# Patient Record
Sex: Female | Born: 1970 | Race: Asian | Hispanic: No | Marital: Married | Smoking: Never smoker
Health system: Southern US, Community
[De-identification: ages and names within clinical notes are randomized; demographics above are authoritative.]

## PROBLEM LIST (undated history)

## (undated) DIAGNOSIS — K922 Gastrointestinal hemorrhage, unspecified: Secondary | ICD-10-CM

## (undated) DIAGNOSIS — B1910 Unspecified viral hepatitis B without hepatic coma: Secondary | ICD-10-CM

## (undated) DIAGNOSIS — Z9221 Personal history of antineoplastic chemotherapy: Secondary | ICD-10-CM

## (undated) DIAGNOSIS — D649 Anemia, unspecified: Secondary | ICD-10-CM

## (undated) DIAGNOSIS — Z923 Personal history of irradiation: Secondary | ICD-10-CM

## (undated) DIAGNOSIS — K279 Peptic ulcer, site unspecified, unspecified as acute or chronic, without hemorrhage or perforation: Secondary | ICD-10-CM

## (undated) DIAGNOSIS — C50919 Malignant neoplasm of unspecified site of unspecified female breast: Secondary | ICD-10-CM

## (undated) DIAGNOSIS — B181 Chronic viral hepatitis B without delta-agent: Secondary | ICD-10-CM

## (undated) DIAGNOSIS — K2970 Gastritis, unspecified, without bleeding: Secondary | ICD-10-CM

## (undated) DIAGNOSIS — Z9012 Acquired absence of left breast and nipple: Secondary | ICD-10-CM

## (undated) HISTORY — DX: Peptic ulcer, site unspecified, unspecified as acute or chronic, without hemorrhage or perforation: K27.9

## (undated) HISTORY — DX: Chronic viral hepatitis B without delta-agent: B18.1

## (undated) HISTORY — DX: Gastritis, unspecified, without bleeding: K29.70

## (undated) HISTORY — DX: Gastrointestinal hemorrhage, unspecified: K92.2

## (undated) HISTORY — DX: Anemia, unspecified: D64.9

## (undated) HISTORY — DX: Malignant neoplasm of unspecified site of unspecified female breast: C50.919

## (undated) HISTORY — DX: Unspecified viral hepatitis B without hepatic coma: B19.10

## (undated) HISTORY — PX: PORTACATH PLACEMENT: SHX2246

## (undated) HISTORY — DX: Acquired absence of left breast and nipple: Z90.12

---

## 1996-04-01 DIAGNOSIS — B181 Chronic viral hepatitis B without delta-agent: Secondary | ICD-10-CM

## 1996-04-01 HISTORY — DX: Chronic viral hepatitis B without delta-agent: B18.1

## 1997-11-22 ENCOUNTER — Emergency Department (HOSPITAL_COMMUNITY): Admission: EM | Admit: 1997-11-22 | Discharge: 1997-11-22 | Payer: Self-pay | Admitting: Emergency Medicine

## 2006-08-11 ENCOUNTER — Encounter: Admission: RE | Admit: 2006-08-11 | Discharge: 2006-08-11 | Payer: Self-pay | Admitting: Obstetrics and Gynecology

## 2009-04-01 DIAGNOSIS — C50919 Malignant neoplasm of unspecified site of unspecified female breast: Secondary | ICD-10-CM

## 2009-04-01 DIAGNOSIS — Z9012 Acquired absence of left breast and nipple: Secondary | ICD-10-CM

## 2009-04-01 DIAGNOSIS — Z9221 Personal history of antineoplastic chemotherapy: Secondary | ICD-10-CM

## 2009-04-01 DIAGNOSIS — Z923 Personal history of irradiation: Secondary | ICD-10-CM

## 2009-04-01 HISTORY — DX: Acquired absence of left breast and nipple: Z90.12

## 2009-04-01 HISTORY — PX: MASTECTOMY: SHX3

## 2009-04-01 HISTORY — DX: Personal history of irradiation: Z92.3

## 2009-04-01 HISTORY — DX: Personal history of antineoplastic chemotherapy: Z92.21

## 2009-04-01 HISTORY — DX: Malignant neoplasm of unspecified site of unspecified female breast: C50.919

## 2009-04-01 HISTORY — PX: OTHER SURGICAL HISTORY: SHX169

## 2009-08-08 ENCOUNTER — Encounter: Admission: RE | Admit: 2009-08-08 | Discharge: 2009-08-08 | Payer: Self-pay | Admitting: General Surgery

## 2009-08-15 ENCOUNTER — Ambulatory Visit: Payer: Self-pay | Admitting: Oncology

## 2009-09-07 ENCOUNTER — Ambulatory Visit: Payer: Self-pay | Admitting: Genetic Counselor

## 2009-10-06 ENCOUNTER — Ambulatory Visit: Payer: Self-pay | Admitting: Oncology

## 2009-10-31 ENCOUNTER — Ambulatory Visit (HOSPITAL_COMMUNITY): Admission: RE | Admit: 2009-10-31 | Discharge: 2009-10-31 | Payer: Self-pay | Admitting: Surgery

## 2009-10-31 ENCOUNTER — Ambulatory Visit (HOSPITAL_COMMUNITY): Admission: RE | Admit: 2009-10-31 | Discharge: 2009-11-01 | Payer: Self-pay | Admitting: Surgery

## 2009-10-31 ENCOUNTER — Encounter (INDEPENDENT_AMBULATORY_CARE_PROVIDER_SITE_OTHER): Payer: Self-pay | Admitting: Surgery

## 2009-12-12 ENCOUNTER — Ambulatory Visit: Payer: Self-pay | Admitting: Oncology

## 2009-12-14 LAB — CBC WITH DIFFERENTIAL/PLATELET
BASO%: 0.6 % (ref 0.0–2.0)
EOS%: 0.6 % (ref 0.0–7.0)
Eosinophils Absolute: 0 10*3/uL (ref 0.0–0.5)
HCT: 39.6 % (ref 34.8–46.6)
MCH: 31.4 pg (ref 25.1–34.0)
MCV: 91.6 fL (ref 79.5–101.0)
NEUT#: 3.9 10*3/uL (ref 1.5–6.5)
RBC: 4.32 10*6/uL (ref 3.70–5.45)
WBC: 5.7 10*3/uL (ref 3.9–10.3)
lymph#: 1.6 10*3/uL (ref 0.9–3.3)

## 2009-12-15 ENCOUNTER — Ambulatory Visit: Payer: Self-pay | Admitting: Internal Medicine

## 2009-12-15 ENCOUNTER — Encounter: Payer: Self-pay | Admitting: Internal Medicine

## 2009-12-15 ENCOUNTER — Ambulatory Visit (HOSPITAL_COMMUNITY): Admission: RE | Admit: 2009-12-15 | Discharge: 2009-12-15 | Payer: Self-pay | Admitting: Oncology

## 2009-12-15 ENCOUNTER — Ambulatory Visit: Payer: Self-pay

## 2009-12-15 ENCOUNTER — Encounter: Payer: Self-pay | Admitting: Oncology

## 2009-12-15 LAB — COMPREHENSIVE METABOLIC PANEL
ALT: 16 U/L (ref 0–35)
AST: 18 U/L (ref 0–37)
Alkaline Phosphatase: 59 U/L (ref 39–117)
BUN: 8 mg/dL (ref 6–23)
CO2: 27 mEq/L (ref 19–32)
Calcium: 8.9 mg/dL (ref 8.4–10.5)
Creatinine, Ser: 0.64 mg/dL (ref 0.40–1.20)
Glucose, Bld: 96 mg/dL (ref 70–99)

## 2009-12-21 ENCOUNTER — Ambulatory Visit (HOSPITAL_COMMUNITY): Admission: RE | Admit: 2009-12-21 | Discharge: 2009-12-21 | Payer: Self-pay | Admitting: Oncology

## 2010-01-02 ENCOUNTER — Ambulatory Visit (HOSPITAL_COMMUNITY): Admission: RE | Admit: 2010-01-02 | Discharge: 2010-01-02 | Payer: Self-pay | Admitting: Surgery

## 2010-01-03 LAB — CBC WITH DIFFERENTIAL/PLATELET
BASO%: 0.2 % (ref 0.0–2.0)
Basophils Absolute: 0 10*3/uL (ref 0.0–0.1)
MCHC: 35.3 g/dL (ref 31.5–36.0)
MCV: 89.2 fL (ref 79.5–101.0)
Platelets: 193 10*3/uL (ref 145–400)
RDW: 12.4 % (ref 11.2–14.5)

## 2010-01-09 LAB — CBC WITH DIFFERENTIAL/PLATELET
BASO%: 1 % (ref 0.0–2.0)
EOS%: 0.5 % (ref 0.0–7.0)
HGB: 14 g/dL (ref 11.6–15.9)
MCH: 31.3 pg (ref 25.1–34.0)
MCHC: 35.3 g/dL (ref 31.5–36.0)
MONO#: 2.5 10*3/uL — ABNORMAL HIGH (ref 0.1–0.9)
RBC: 4.47 10*6/uL (ref 3.70–5.45)
WBC: 12.9 10*3/uL — ABNORMAL HIGH (ref 3.9–10.3)
lymph#: 2.7 10*3/uL (ref 0.9–3.3)

## 2010-01-09 LAB — BASIC METABOLIC PANEL
Creatinine, Ser: 0.58 mg/dL (ref 0.40–1.20)
Glucose, Bld: 122 mg/dL — ABNORMAL HIGH (ref 70–99)
Potassium: 3.8 mEq/L (ref 3.5–5.3)
Sodium: 138 mEq/L (ref 135–145)

## 2010-01-15 ENCOUNTER — Ambulatory Visit: Payer: Self-pay | Admitting: Oncology

## 2010-01-16 LAB — CBC WITH DIFFERENTIAL/PLATELET
Basophils Absolute: 0 10*3/uL (ref 0.0–0.1)
LYMPH%: 14.6 % (ref 14.0–49.7)
MCHC: 35.3 g/dL (ref 31.5–36.0)
MONO#: 0.4 10*3/uL (ref 0.1–0.9)
NEUT#: 7.6 10*3/uL — ABNORMAL HIGH (ref 1.5–6.5)
NEUT%: 81.1 % — ABNORMAL HIGH (ref 38.4–76.8)
RDW: 12.8 % (ref 11.2–14.5)
WBC: 9.4 10*3/uL (ref 3.9–10.3)
lymph#: 1.4 10*3/uL (ref 0.9–3.3)

## 2010-01-23 LAB — COMPREHENSIVE METABOLIC PANEL
AST: 19 U/L (ref 0–37)
Alkaline Phosphatase: 74 U/L (ref 39–117)
BUN: 11 mg/dL (ref 6–23)
CO2: 24 mEq/L (ref 19–32)
Creatinine, Ser: 0.47 mg/dL (ref 0.40–1.20)
Potassium: 4.1 mEq/L (ref 3.5–5.3)

## 2010-01-23 LAB — CBC WITH DIFFERENTIAL/PLATELET
Basophils Absolute: 0 10*3/uL (ref 0.0–0.1)
EOS%: 0 % (ref 0.0–7.0)
Eosinophils Absolute: 0 10*3/uL (ref 0.0–0.5)
HGB: 12.7 g/dL (ref 11.6–15.9)
LYMPH%: 12.1 % — ABNORMAL LOW (ref 14.0–49.7)
MCH: 31.7 pg (ref 25.1–34.0)
NEUT%: 82.6 % — ABNORMAL HIGH (ref 38.4–76.8)
Platelets: 204 10*3/uL (ref 145–400)
WBC: 9.9 10*3/uL (ref 3.9–10.3)

## 2010-01-30 LAB — CBC WITH DIFFERENTIAL/PLATELET
Basophils Absolute: 0.1 10*3/uL (ref 0.0–0.1)
HCT: 35.1 % (ref 34.8–46.6)
HGB: 12.3 g/dL (ref 11.6–15.9)
MCHC: 35 g/dL (ref 31.5–36.0)
MCV: 89.5 fL (ref 79.5–101.0)
MONO%: 14.6 % — ABNORMAL HIGH (ref 0.0–14.0)
NEUT%: 70.7 % (ref 38.4–76.8)

## 2010-02-06 LAB — CBC WITH DIFFERENTIAL/PLATELET
EOS%: 0.1 % (ref 0.0–7.0)
Eosinophils Absolute: 0 10*3/uL (ref 0.0–0.5)
LYMPH%: 18.9 % (ref 14.0–49.7)
MCH: 31.1 pg (ref 25.1–34.0)
MONO#: 0.4 10*3/uL (ref 0.1–0.9)
NEUT#: 5.8 10*3/uL (ref 1.5–6.5)
NEUT%: 75 % (ref 38.4–76.8)
Platelets: 134 10*3/uL — ABNORMAL LOW (ref 145–400)
RBC: 3.7 10*6/uL (ref 3.70–5.45)
RDW: 14.2 % (ref 11.2–14.5)
lymph#: 1.5 10*3/uL (ref 0.9–3.3)
nRBC: 0 % (ref 0–0)

## 2010-02-13 LAB — CBC WITH DIFFERENTIAL/PLATELET
BASO%: 0 % (ref 0.0–2.0)
Basophils Absolute: 0 10e3/uL (ref 0.0–0.1)
EOS%: 0 % (ref 0.0–7.0)
Eosinophils Absolute: 0 10e3/uL (ref 0.0–0.5)
HCT: 35 % (ref 34.8–46.6)
HGB: 12.2 g/dL (ref 11.6–15.9)
LYMPH%: 8.7 % — ABNORMAL LOW (ref 14.0–49.7)
MCH: 31.5 pg (ref 25.1–34.0)
MCHC: 34.9 g/dL (ref 31.5–36.0)
MCV: 90.4 fL (ref 79.5–101.0)
MONO#: 0.2 10e3/uL (ref 0.1–0.9)
MONO%: 1.6 % (ref 0.0–14.0)
NEUT#: 8.5 10e3/uL — ABNORMAL HIGH (ref 1.5–6.5)
NEUT%: 89.7 % — ABNORMAL HIGH (ref 38.4–76.8)
Platelets: 183 10e3/uL (ref 145–400)
RBC: 3.87 10e6/uL (ref 3.70–5.45)
RDW: 14.6 % — ABNORMAL HIGH (ref 11.2–14.5)
WBC: 9.5 10e3/uL (ref 3.9–10.3)
lymph#: 0.8 10e3/uL — ABNORMAL LOW (ref 0.9–3.3)
nRBC: 0 % (ref 0–0)

## 2010-02-13 LAB — COMPREHENSIVE METABOLIC PANEL WITH GFR
ALT: 28 U/L (ref 0–35)
AST: 25 U/L (ref 0–37)
Albumin: 4.3 g/dL (ref 3.5–5.2)
Alkaline Phosphatase: 75 U/L (ref 39–117)
BUN: 11 mg/dL (ref 6–23)
CO2: 21 meq/L (ref 19–32)
Calcium: 9.5 mg/dL (ref 8.4–10.5)
Chloride: 106 meq/L (ref 96–112)
Creatinine, Ser: 0.59 mg/dL (ref 0.40–1.20)
Glucose, Bld: 163 mg/dL — ABNORMAL HIGH (ref 70–99)
Potassium: 3.9 meq/L (ref 3.5–5.3)
Sodium: 138 meq/L (ref 135–145)
Total Bilirubin: 0.6 mg/dL (ref 0.3–1.2)
Total Protein: 7 g/dL (ref 6.0–8.3)

## 2010-02-14 ENCOUNTER — Ambulatory Visit: Payer: Self-pay | Admitting: Oncology

## 2010-02-20 LAB — CBC WITH DIFFERENTIAL/PLATELET
BASO%: 0.5 % (ref 0.0–2.0)
EOS%: 0.2 % (ref 0.0–7.0)
Eosinophils Absolute: 0 10*3/uL (ref 0.0–0.5)
HCT: 33.7 % — ABNORMAL LOW (ref 34.8–46.6)
HGB: 11.9 g/dL (ref 11.6–15.9)
LYMPH%: 19.7 % (ref 14.0–49.7)
MCH: 32.1 pg (ref 25.1–34.0)
MONO%: 13.1 % (ref 0.0–14.0)
NEUT#: 8.8 10*3/uL — ABNORMAL HIGH (ref 1.5–6.5)
NEUT%: 66.5 % (ref 38.4–76.8)
RDW: 14.4 % (ref 11.2–14.5)
WBC: 13.3 10*3/uL — ABNORMAL HIGH (ref 3.9–10.3)
lymph#: 2.6 10*3/uL (ref 0.9–3.3)

## 2010-02-27 LAB — CBC WITH DIFFERENTIAL/PLATELET
BASO%: 0.4 % (ref 0.0–2.0)
EOS%: 0.1 % (ref 0.0–7.0)
Eosinophils Absolute: 0 10*3/uL (ref 0.0–0.5)
MCH: 32.8 pg (ref 25.1–34.0)
MCHC: 34.7 g/dL (ref 31.5–36.0)
MCV: 94.6 fL (ref 79.5–101.0)
MONO%: 4.2 % (ref 0.0–14.0)
NEUT#: 4.2 10*3/uL (ref 1.5–6.5)
RBC: 3.36 10*6/uL — ABNORMAL LOW (ref 3.70–5.45)
RDW: 15.9 % — ABNORMAL HIGH (ref 11.2–14.5)

## 2010-03-01 ENCOUNTER — Ambulatory Visit: Payer: Self-pay | Admitting: Gastroenterology

## 2010-03-06 LAB — CBC WITH DIFFERENTIAL/PLATELET
BASO%: 0.1 % (ref 0.0–2.0)
EOS%: 0 % (ref 0.0–7.0)
HCT: 33.8 % — ABNORMAL LOW (ref 34.8–46.6)
LYMPH%: 10.9 % — ABNORMAL LOW (ref 14.0–49.7)
MCH: 32.2 pg (ref 25.1–34.0)
MCV: 94.7 fL (ref 79.5–101.0)
MONO%: 6 % (ref 0.0–14.0)
NEUT#: 8.6 10*3/uL — ABNORMAL HIGH (ref 1.5–6.5)
NEUT%: 83 % — ABNORMAL HIGH (ref 38.4–76.8)
Platelets: 182 10*3/uL (ref 145–400)
RBC: 3.57 10*6/uL — ABNORMAL LOW (ref 3.70–5.45)
nRBC: 0 % (ref 0–0)

## 2010-03-06 LAB — COMPREHENSIVE METABOLIC PANEL
Albumin: 4.3 g/dL (ref 3.5–5.2)
Alkaline Phosphatase: 71 U/L (ref 39–117)
CO2: 22 mEq/L (ref 19–32)
Chloride: 104 mEq/L (ref 96–112)
Potassium: 4.1 mEq/L (ref 3.5–5.3)
Sodium: 140 mEq/L (ref 135–145)
Total Bilirubin: 0.7 mg/dL (ref 0.3–1.2)

## 2010-03-13 ENCOUNTER — Encounter: Payer: Self-pay | Admitting: Oncology

## 2010-03-13 ENCOUNTER — Ambulatory Visit: Admit: 2010-03-13 | Payer: Self-pay | Admitting: Oncology

## 2010-03-13 ENCOUNTER — Ambulatory Visit
Admission: RE | Admit: 2010-03-13 | Discharge: 2010-03-13 | Payer: Self-pay | Source: Home / Self Care | Attending: Oncology | Admitting: Oncology

## 2010-03-13 LAB — CBC WITH DIFFERENTIAL/PLATELET
Eosinophils Absolute: 0 10*3/uL (ref 0.0–0.5)
HCT: 32.9 % — ABNORMAL LOW (ref 34.8–46.6)
HGB: 11.2 g/dL — ABNORMAL LOW (ref 11.6–15.9)
MCV: 95.1 fL (ref 79.5–101.0)
NEUT#: 5.7 10*3/uL (ref 1.5–6.5)
WBC: 9.9 10*3/uL (ref 3.9–10.3)

## 2010-03-16 ENCOUNTER — Inpatient Hospital Stay (HOSPITAL_COMMUNITY)
Admission: EM | Admit: 2010-03-16 | Discharge: 2010-03-18 | Payer: Self-pay | Source: Home / Self Care | Attending: Internal Medicine | Admitting: Internal Medicine

## 2010-03-17 ENCOUNTER — Encounter: Payer: Self-pay | Admitting: Gastroenterology

## 2010-03-19 ENCOUNTER — Ambulatory Visit: Payer: Self-pay | Admitting: Oncology

## 2010-03-20 ENCOUNTER — Telehealth: Payer: Self-pay | Admitting: Gastroenterology

## 2010-03-20 LAB — CBC WITH DIFFERENTIAL/PLATELET
BASO%: 0.2 % (ref 0.0–2.0)
Basophils Absolute: 0 10*3/uL (ref 0.0–0.1)
HCT: 32.5 % — ABNORMAL LOW (ref 34.8–46.6)
HGB: 11.1 g/dL — ABNORMAL LOW (ref 11.6–15.9)
MCH: 32.6 pg (ref 25.1–34.0)
MCHC: 34.2 g/dL (ref 31.5–36.0)
MCV: 95.6 fL (ref 79.5–101.0)
MONO#: 0.4 10*3/uL (ref 0.1–0.9)
MONO%: 4.2 % (ref 0.0–14.0)
Platelets: 132 10*3/uL — ABNORMAL LOW (ref 145–400)
RBC: 3.4 10*6/uL — ABNORMAL LOW (ref 3.70–5.45)
WBC: 10.1 10*3/uL (ref 3.9–10.3)
lymph#: 1.3 10*3/uL (ref 0.9–3.3)
nRBC: 0 % (ref 0–0)

## 2010-03-27 ENCOUNTER — Telehealth: Payer: Self-pay | Admitting: Gastroenterology

## 2010-03-27 ENCOUNTER — Encounter (INDEPENDENT_AMBULATORY_CARE_PROVIDER_SITE_OTHER): Payer: Self-pay | Admitting: *Deleted

## 2010-04-06 LAB — CBC WITH DIFFERENTIAL/PLATELET
BASO%: 0.2 % (ref 0.0–2.0)
Basophils Absolute: 0 10*3/uL (ref 0.0–0.1)
EOS%: 0 % (ref 0.0–7.0)
Eosinophils Absolute: 0 10*3/uL (ref 0.0–0.5)
HCT: 36 % (ref 34.8–46.6)
HGB: 12.2 g/dL (ref 11.6–15.9)
LYMPH%: 12.1 % — ABNORMAL LOW (ref 14.0–49.7)
MCH: 32.4 pg (ref 25.1–34.0)
MCHC: 33.9 g/dL (ref 31.5–36.0)
MCV: 95.5 fL (ref 79.5–101.0)
MONO#: 0.1 10*3/uL (ref 0.1–0.9)
MONO%: 1.2 % (ref 0.0–14.0)
NEUT#: 5.1 10*3/uL (ref 1.5–6.5)
NEUT%: 86.5 % — ABNORMAL HIGH (ref 38.4–76.8)
Platelets: 201 10*3/uL (ref 145–400)
RBC: 3.77 10*6/uL (ref 3.70–5.45)
RDW: 13.8 % (ref 11.2–14.5)
WBC: 5.9 10*3/uL (ref 3.9–10.3)
lymph#: 0.7 10*3/uL — ABNORMAL LOW (ref 0.9–3.3)
nRBC: 0 % (ref 0–0)

## 2010-04-06 LAB — BASIC METABOLIC PANEL
BUN: 8 mg/dL (ref 6–23)
CO2: 18 mEq/L — ABNORMAL LOW (ref 19–32)
Calcium: 7.5 mg/dL — ABNORMAL LOW (ref 8.4–10.5)
Chloride: 116 mEq/L — ABNORMAL HIGH (ref 96–112)
Creatinine, Ser: 0.43 mg/dL (ref 0.40–1.20)
Glucose, Bld: 125 mg/dL — ABNORMAL HIGH (ref 70–99)
Potassium: 3.1 mEq/L — ABNORMAL LOW (ref 3.5–5.3)
Sodium: 145 mEq/L (ref 135–145)

## 2010-04-13 ENCOUNTER — Ambulatory Visit: Admit: 2010-04-13 | Payer: Self-pay | Admitting: Gastroenterology

## 2010-04-17 LAB — CBC WITH DIFFERENTIAL/PLATELET
BASO%: 0.1 % (ref 0.0–2.0)
Basophils Absolute: 0 10*3/uL (ref 0.0–0.1)
EOS%: 0 % (ref 0.0–7.0)
Eosinophils Absolute: 0 10*3/uL (ref 0.0–0.5)
HCT: 33.3 % — ABNORMAL LOW (ref 34.8–46.6)
HGB: 11.2 g/dL — ABNORMAL LOW (ref 11.6–15.9)
LYMPH%: 18.6 % (ref 14.0–49.7)
MCH: 32.2 pg (ref 25.1–34.0)
MCHC: 33.6 g/dL (ref 31.5–36.0)
MCV: 95.7 fL (ref 79.5–101.0)
MONO#: 0.5 10*3/uL (ref 0.1–0.9)
MONO%: 5.6 % (ref 0.0–14.0)
NEUT#: 7.3 10*3/uL — ABNORMAL HIGH (ref 1.5–6.5)
NEUT%: 75.7 % (ref 38.4–76.8)
Platelets: 117 10*3/uL — ABNORMAL LOW (ref 145–400)
RBC: 3.48 10*6/uL — ABNORMAL LOW (ref 3.70–5.45)
RDW: 13.5 % (ref 11.2–14.5)
WBC: 9.6 10*3/uL (ref 3.9–10.3)
lymph#: 1.8 10*3/uL (ref 0.9–3.3)

## 2010-04-20 ENCOUNTER — Ambulatory Visit (HOSPITAL_BASED_OUTPATIENT_CLINIC_OR_DEPARTMENT_OTHER): Payer: Self-pay | Admitting: Oncology

## 2010-04-22 ENCOUNTER — Encounter: Payer: Self-pay | Admitting: Gastroenterology

## 2010-04-24 LAB — CBC WITH DIFFERENTIAL/PLATELET
BASO%: 0.2 % (ref 0.0–2.0)
Basophils Absolute: 0 10*3/uL (ref 0.0–0.1)
EOS%: 0.2 % (ref 0.0–7.0)
Eosinophils Absolute: 0 10*3/uL (ref 0.0–0.5)
HGB: 11.1 g/dL — ABNORMAL LOW (ref 11.6–15.9)
LYMPH%: 24 % (ref 14.0–49.7)
MCH: 32.4 pg (ref 25.1–34.0)
MCHC: 33.4 g/dL (ref 31.5–36.0)
NEUT%: 64.7 % (ref 38.4–76.8)
Platelets: 146 10*3/uL (ref 145–400)
RBC: 3.43 10*6/uL — ABNORMAL LOW (ref 3.70–5.45)
RDW: 13.9 % (ref 11.2–14.5)

## 2010-04-24 LAB — URINALYSIS, MICROSCOPIC - CHCC
Glucose: NEGATIVE g/dL
Ketones: NEGATIVE mg/dL
Nitrite: NEGATIVE
Protein: <30 mg/dL
Specific Gravity, Urine: 1.005 (ref 1.003–1.035)

## 2010-04-24 LAB — COMPREHENSIVE METABOLIC PANEL
ALT: 21 U/L (ref 0–35)
Albumin: 3.5 g/dL (ref 3.5–5.2)
Alkaline Phosphatase: 65 U/L (ref 39–117)
Calcium: 9 mg/dL (ref 8.4–10.5)
Glucose, Bld: 101 mg/dL — ABNORMAL HIGH (ref 70–99)
Potassium: 3.7 mEq/L (ref 3.5–5.3)
Total Protein: 6.9 g/dL (ref 6.0–8.3)

## 2010-05-01 LAB — COMPREHENSIVE METABOLIC PANEL
Alkaline Phosphatase: 67 U/L (ref 39–117)
BUN: 13 mg/dL (ref 6–23)
CO2: 23 mEq/L (ref 19–32)
Chloride: 106 mEq/L (ref 96–112)
Creatinine, Ser: 0.56 mg/dL (ref 0.40–1.20)
Sodium: 141 mEq/L (ref 135–145)
Total Bilirubin: 0.5 mg/dL (ref 0.3–1.2)
Total Protein: 6.8 g/dL (ref 6.0–8.3)

## 2010-05-01 LAB — CBC WITH DIFFERENTIAL/PLATELET
BASO%: 0.4 % (ref 0.0–2.0)
EOS%: 0 % (ref 0.0–7.0)
HGB: 11 g/dL — ABNORMAL LOW (ref 11.6–15.9)
MONO#: 0.1 10*3/uL (ref 0.1–0.9)
MONO%: 0.9 % (ref 0.0–14.0)
NEUT#: 6.2 10*3/uL (ref 1.5–6.5)
Platelets: 223 10*3/uL (ref 145–400)
RDW: 14.3 % (ref 11.2–14.5)

## 2010-05-02 ENCOUNTER — Encounter (HOSPITAL_BASED_OUTPATIENT_CLINIC_OR_DEPARTMENT_OTHER): Payer: Self-pay | Admitting: Oncology

## 2010-05-02 DIAGNOSIS — C50419 Malignant neoplasm of upper-outer quadrant of unspecified female breast: Secondary | ICD-10-CM

## 2010-05-03 ENCOUNTER — Ambulatory Visit: Payer: Self-pay | Admitting: Gastroenterology

## 2010-05-03 ENCOUNTER — Ambulatory Visit: Admit: 2010-05-03 | Payer: Self-pay | Admitting: Gastroenterology

## 2010-05-03 NOTE — Progress Notes (Signed)
Summary: Appts  Phone Note Call from Patient Call back at [phone]   Caller: Patient Call For: Dr. Christella Hartigan Reason for Call: Talk to Nurse Summary of Call: pt was told to schedule appt in two or three weeks to f/u with Dr. Christella Hartigan from [facility], you can call Connye Burkitt her interpreter to schdule the appt Initial call taken by: [place] Johnson,  March 20, 2010 11:21 AM  Follow-up for Phone Call        left message on machine to call back Chales Abrahams CMA Duncan Dull)  March 20, 2010 12:51 PM   Spoke with the interpretor and she was given the info rx was called to [facility] prescription [phone].   Follow-up by: Chales Abrahams CMA Duncan Dull),  March 20, 2010 1:03 PM

## 2010-05-03 NOTE — Letter (Signed)
Summary: New Patient letter  [facility]  [address] [place], [place] [address]   Phone: [phone]  Fax: [phone]       03/27/2010 MRN: [id number]  Lauren Cohen [address] [place], [place]  [address]  Dear Ms. Lauren Cohen,  Welcome to the Gastroenterology Division at [facility].    You are scheduled to see Dr.  Rob Bunting on Friday-January 13,2012 at  10:15 a.m on the 3rd floor at [facility], [address]  We ask that you try to arrive at our office 15 minutes prior to your appointment time to allow for check-in.  We would like you to complete the enclosed self-administered evaluation form prior to your visit and bring it with you on the day of your appointment.  We will review it with you.  Also, please bring a complete list of all your medications or, if you prefer, bring the medication bottles and we will list them.  Please bring your insurance card so that we may make a copy of it.  If your insurance requires a referral to see a specialist, please bring your referral form from your primary care physician.  Co-payments are due at the time of your visit and may be paid by cash, check or credit card.     Your office visit will consist of a consult with your physician (includes a physical exam), any laboratory testing he/she may order, scheduling of any necessary diagnostic testing (e.g. x-ray, ultrasound, CT-scan), and scheduling of a procedure (e.g. Endoscopy, Colonoscopy) if required.  Please allow enough time on your schedule to allow for any/all of these possibilities.    If you cannot keep your appointment, please call [phone] to cancel or reschedule prior to your appointment date.  This allows [place] the opportunity to schedule an appointment for another patient in need of care.  If you do not cancel or reschedule by 5 p.m. the business day prior to your appointment date, you will be charged a $50.00 late cancellation/no-show fee.    Thank you for  choosing [facility] for your medical needs.  We appreciate the opportunity to care for you.  Please visit [place] at our website  to learn more about our practice.           Since you do not have insurance I am sending you the form with the information on to call  to see if you are eligible for payment assistance.If not please make arrangements to pay at the time of the office visit.Please call our office if you have any questions as I have made your interpreter aware of this-Cheryl lohr,R.N.           Sincerely,                                                             The Gastroenterology Division

## 2010-05-03 NOTE — Progress Notes (Signed)
Summary: Appts  Phone Note Call from Patient Call back at Home Phone [phone] Call back at [phone]   Caller: interpreter Call For: Dr. Christella Hartigan Reason for Call: Talk to Nurse Summary of Call: interpreter is calling because pt wants to be seen this week or next as a follow up from hospital? Initial call taken by: [place] Johnson,  March 27, 2010 11:12 AM  Follow-up for Phone Call        Interpreter Connye Burkitt informed pt. scheduled for Friday Jan.13/2012 and pt. has no insurance.Informed that cash will be due at time of visit but I will look into forms for any discounts and get back to him.New pt. letter sent to pt.along with form for assistance for paying bill.Interpreter informed. Follow-up by: Teryl Lucy RN,  March 27, 2010 11:48 AM

## 2010-05-03 NOTE — Procedures (Signed)
Summary: Upper Endoscopy  Patient: Lauren Cohen Note: All result statuses are Final unless otherwise noted.  Tests: (1) Upper Endoscopy (EGD)   EGD Upper Endoscopy       DONE     [facility]     [address] [place], [place]  [address]           ENDOSCOPY PROCEDURE REPORT           PATIENT:  Lauren Cohen, Lauren Cohen  MR#:  [id number]     BIRTHDATE:  1970/09/05, 39 yrs. old  GENDER:  female     ENDOSCOPIST:  Rachael Fee, MD     PROCEDURE DATE:  03/17/2010     PROCEDURE:  EGD with biopsy, 43239     ASA CLASS:  Class II     INDICATIONS:  hematemesis, melena     MEDICATIONS:  Fentanyl 37.5 mcg IV, Versed 5 mg IV     TOPICAL ANESTHETIC:  Cetacaine Spray           DESCRIPTION OF PROCEDURE:   After the risks benefits and     alternatives of the procedure were thoroughly explained, informed     consent was obtained.  The  endoscope was introduced through the     mouth and advanced to the second portion of the duodenum, without     limitations.  The instrument was slowly withdrawn as the mucosa     was fully examined.     <<PROCEDUREIMAGES>>     There was a 5mm clean based, shallow ulcer in proximal duodenal     bulb. There were no visible vessels. The surrounding mucosa was     inflamed, friable. Biopsies were taken from distal stomach to     check for H. pylori. There was mild, non-specific gastritis,     proximal stomach (see image4 and image5).  Otherwise the     examination was normal (see image2, image3, and image1).     Retroflexed views revealed no abnormalities.    The scope was then     withdrawn from the patient and the procedure completed.     COMPLICATIONS:  None           ENDOSCOPIC IMPRESSION:     1) Clean based, duodenal ulcer.  This is the source of her     bleeding and is at low risk for rebleeding.     2) Mild gastritis, biopsied to check for H. pylori     3) Otherwise normal examination           RECOMMENDATIONS:     Restart diet.  IF biopsies show H.  pylori, then will treat with     appropriate antibiotics.  CBC in AM.           ______________________________     Rachael Fee, MD           n.     eSIGNED:   Rachael Fee at 03/17/2010 08:44 AM           Lauren Cohen, [id number]  Note: An exclamation mark (!) indicates a result that was not dispersed into the flowsheet. Document Creation Date: 03/17/2010 8:44 AM _______________________________________________________________________  (1) Order result status: Final Collection or observation date-time: 03/17/2010 08:31 Requested date-time:  Receipt date-time:  Reported date-time:  Referring Physician:   Ordering Physician: Rob Bunting [phone]) Specimen Source:  Source: Launa Grill Order Number: [id number] Lab site:

## 2010-05-08 ENCOUNTER — Encounter (HOSPITAL_BASED_OUTPATIENT_CLINIC_OR_DEPARTMENT_OTHER): Payer: Self-pay | Admitting: Oncology

## 2010-05-08 DIAGNOSIS — C50419 Malignant neoplasm of upper-outer quadrant of unspecified female breast: Secondary | ICD-10-CM

## 2010-05-08 DIAGNOSIS — Z5112 Encounter for antineoplastic immunotherapy: Secondary | ICD-10-CM

## 2010-05-08 LAB — CBC WITH DIFFERENTIAL/PLATELET
BASO%: 0.4 % (ref 0.0–2.0)
Basophils Absolute: 0.1 10*3/uL (ref 0.0–0.1)
Eosinophils Absolute: 0 10*3/uL (ref 0.0–0.5)
LYMPH%: 22.3 % (ref 14.0–49.7)
MCH: 32.3 pg (ref 25.1–34.0)
Platelets: 145 10*3/uL (ref 145–400)
RBC: 3.84 10*6/uL (ref 3.70–5.45)
RDW: 13.2 % (ref 11.2–14.5)

## 2010-05-15 ENCOUNTER — Encounter (HOSPITAL_BASED_OUTPATIENT_CLINIC_OR_DEPARTMENT_OTHER): Payer: Self-pay | Admitting: Oncology

## 2010-05-15 ENCOUNTER — Other Ambulatory Visit: Payer: Self-pay | Admitting: Gastroenterology

## 2010-05-15 DIAGNOSIS — Z5112 Encounter for antineoplastic immunotherapy: Secondary | ICD-10-CM

## 2010-05-15 DIAGNOSIS — C50419 Malignant neoplasm of upper-outer quadrant of unspecified female breast: Secondary | ICD-10-CM

## 2010-05-15 DIAGNOSIS — B181 Chronic viral hepatitis B without delta-agent: Secondary | ICD-10-CM

## 2010-05-15 LAB — CBC WITH DIFFERENTIAL/PLATELET
Basophils Absolute: 0.1 10*3/uL (ref 0.0–0.1)
Eosinophils Absolute: 0 10*3/uL (ref 0.0–0.5)
HCT: 32.5 % — ABNORMAL LOW (ref 34.8–46.6)
MCH: 32.3 pg (ref 25.1–34.0)
MCV: 96.9 fL (ref 79.5–101.0)
RBC: 3.35 10*6/uL — ABNORMAL LOW (ref 3.70–5.45)
WBC: 8.1 10*3/uL (ref 3.9–10.3)

## 2010-05-21 ENCOUNTER — Ambulatory Visit (HOSPITAL_COMMUNITY)
Admission: RE | Admit: 2010-05-21 | Discharge: 2010-05-21 | Disposition: A | Discharge status: Home / Self Care | Payer: Self-pay | Source: Ambulatory Visit | Attending: Gastroenterology | Admitting: Gastroenterology

## 2010-05-21 DIAGNOSIS — B181 Chronic viral hepatitis B without delta-agent: Secondary | ICD-10-CM | POA: Insufficient documentation

## 2010-05-21 DIAGNOSIS — Z853 Personal history of malignant neoplasm of breast: Secondary | ICD-10-CM | POA: Insufficient documentation

## 2010-06-05 ENCOUNTER — Other Ambulatory Visit: Payer: Self-pay | Admitting: Oncology

## 2010-06-05 ENCOUNTER — Ambulatory Visit (HOSPITAL_COMMUNITY)
Admission: RE | Admit: 2010-06-05 | Discharge: 2010-06-05 | Disposition: A | Discharge status: Home / Self Care | Payer: Self-pay | Source: Ambulatory Visit | Attending: Oncology | Admitting: Oncology

## 2010-06-05 DIAGNOSIS — Z01818 Encounter for other preprocedural examination: Secondary | ICD-10-CM | POA: Insufficient documentation

## 2010-06-05 DIAGNOSIS — C50419 Malignant neoplasm of upper-outer quadrant of unspecified female breast: Secondary | ICD-10-CM

## 2010-06-05 LAB — CBC WITH DIFFERENTIAL/PLATELET
Basophils Absolute: 0 10*3/uL (ref 0.0–0.1)
EOS%: 2.6 % (ref 0.0–7.0)
Eosinophils Absolute: 0.1 10*3/uL (ref 0.0–0.5)
HCT: 34.7 % — ABNORMAL LOW (ref 34.8–46.6)
HGB: 11.9 g/dL (ref 11.6–15.9)
MCH: 32.7 pg (ref 25.1–34.0)
MCV: 95.2 fL (ref 79.5–101.0)
MONO%: 6.9 % (ref 0.0–14.0)
NEUT#: 2.9 10*3/uL (ref 1.5–6.5)
NEUT%: 57.7 % (ref 38.4–76.8)
lymph#: 1.6 10*3/uL (ref 0.9–3.3)

## 2010-06-05 LAB — COMPREHENSIVE METABOLIC PANEL
ALT: 22 U/L (ref 0–35)
AST: 28 U/L (ref 0–37)
Albumin: 3.7 g/dL (ref 3.5–5.2)
Alkaline Phosphatase: 68 U/L (ref 39–117)
Potassium: 4 mEq/L (ref 3.5–5.3)
Sodium: 143 mEq/L (ref 135–145)
Total Protein: 6.7 g/dL (ref 6.0–8.3)

## 2010-06-11 LAB — CBC
HCT: 35 % — ABNORMAL LOW (ref 36.0–46.0)
MCHC: 32.9 g/dL (ref 30.0–36.0)
MCHC: 33.1 g/dL (ref 30.0–36.0)
MCV: 98.6 fL (ref 78.0–100.0)
Platelets: 126 10*3/uL — ABNORMAL LOW (ref 150–400)
RDW: 14.9 % (ref 11.5–15.5)
RDW: 14.9 % (ref 11.5–15.5)
WBC: 10.6 10*3/uL — ABNORMAL HIGH (ref 4.0–10.5)

## 2010-06-11 LAB — PROTIME-INR
INR: 0.94 (ref 0.00–1.49)
Prothrombin Time: 12.8 seconds (ref 11.6–15.2)

## 2010-06-11 LAB — DIFFERENTIAL
Basophils Absolute: 0 10*3/uL (ref 0.0–0.1)
Basophils Relative: 0 % (ref 0–1)
Monocytes Relative: 7 % (ref 3–12)
Neutro Abs: 9.7 10*3/uL — ABNORMAL HIGH (ref 1.7–7.7)
Neutrophils Relative %: 80 % — ABNORMAL HIGH (ref 43–77)

## 2010-06-11 LAB — HEMOGLOBIN AND HEMATOCRIT, BLOOD
HCT: 29.6 % — ABNORMAL LOW (ref 36.0–46.0)
Hemoglobin: 9.8 g/dL — ABNORMAL LOW (ref 12.0–15.0)

## 2010-06-11 LAB — COMPREHENSIVE METABOLIC PANEL
Alkaline Phosphatase: 115 U/L (ref 39–117)
BUN: 7 mg/dL (ref 6–23)
Calcium: 9.1 mg/dL (ref 8.4–10.5)
GFR calc non Af Amer: >60 mL/min (ref >60)
Glucose, Bld: 113 mg/dL — ABNORMAL HIGH (ref 70–99)
Potassium: 3.8 mEq/L (ref 3.5–5.1)
Total Protein: 7.3 g/dL (ref 6.0–8.3)

## 2010-06-11 LAB — PHOSPHORUS: Phosphorus: 4.1 mg/dL (ref 2.3–4.6)

## 2010-06-11 LAB — TSH: TSH: 1 u[IU]/mL (ref 0.350–4.500)

## 2010-06-11 LAB — OCCULT BLOOD, POC DEVICE: Fecal Occult Bld: NEGATIVE

## 2010-06-11 LAB — APTT: aPTT: 29 seconds (ref 24–37)

## 2010-06-14 ENCOUNTER — Ambulatory Visit (INDEPENDENT_AMBULATORY_CARE_PROVIDER_SITE_OTHER): Payer: Self-pay | Admitting: Gastroenterology

## 2010-06-14 DIAGNOSIS — B181 Chronic viral hepatitis B without delta-agent: Secondary | ICD-10-CM

## 2010-06-14 LAB — BASIC METABOLIC PANEL
Calcium: 9.4 mg/dL (ref 8.4–10.5)
GFR calc Af Amer: >60 mL/min (ref >60)
GFR calc non Af Amer: >60 mL/min (ref >60)
Potassium: 4.1 mEq/L (ref 3.5–5.1)
Sodium: 139 mEq/L (ref 135–145)

## 2010-06-14 LAB — HCG, SERUM, QUALITATIVE: Preg, Serum: NEGATIVE

## 2010-06-14 LAB — CBC
HCT: 39.7 % (ref 36.0–46.0)
Hemoglobin: 13.9 g/dL (ref 12.0–15.0)
WBC: 4.5 10*3/uL (ref 4.0–10.5)

## 2010-06-14 LAB — SURGICAL PCR SCREEN
MRSA, PCR: NEGATIVE
Staphylococcus aureus: NEGATIVE

## 2010-06-16 LAB — DIFFERENTIAL
Eosinophils Absolute: 0 10*3/uL (ref 0.0–0.7)
Eosinophils Relative: 1 % (ref 0–5)
Lymphocytes Relative: 34 % (ref 12–46)
Lymphs Abs: 1.9 10*3/uL (ref 0.7–4.0)
Monocytes Absolute: 0.3 10*3/uL (ref 0.1–1.0)
Monocytes Relative: 6 % (ref 3–12)

## 2010-06-16 LAB — COMPREHENSIVE METABOLIC PANEL
ALT: 25 U/L (ref 0–35)
AST: 29 U/L (ref 0–37)
Albumin: 3.6 g/dL (ref 3.5–5.2)
CO2: 27 mEq/L (ref 19–32)
Calcium: 9 mg/dL (ref 8.4–10.5)
Chloride: 107 mEq/L (ref 96–112)
Creatinine, Ser: 0.69 mg/dL (ref 0.4–1.2)
GFR calc Af Amer: >60 mL/min (ref >60)
GFR calc non Af Amer: >60 mL/min (ref >60)
Sodium: 139 mEq/L (ref 135–145)

## 2010-06-16 LAB — CBC
Hemoglobin: 13.5 g/dL (ref 12.0–15.0)
MCHC: 34.5 g/dL (ref 30.0–36.0)
Platelets: 172 10*3/uL (ref 150–400)
RBC: 4.18 MIL/uL (ref 3.87–5.11)

## 2010-06-16 LAB — HCG, SERUM, QUALITATIVE: Preg, Serum: NEGATIVE

## 2010-06-26 ENCOUNTER — Encounter (HOSPITAL_BASED_OUTPATIENT_CLINIC_OR_DEPARTMENT_OTHER): Payer: Self-pay | Admitting: Oncology

## 2010-06-26 ENCOUNTER — Other Ambulatory Visit: Payer: Self-pay | Admitting: Oncology

## 2010-06-26 DIAGNOSIS — Z5112 Encounter for antineoplastic immunotherapy: Secondary | ICD-10-CM

## 2010-06-26 DIAGNOSIS — C50419 Malignant neoplasm of upper-outer quadrant of unspecified female breast: Secondary | ICD-10-CM

## 2010-06-26 LAB — CBC WITH DIFFERENTIAL/PLATELET
Basophils Absolute: 0 10*3/uL (ref 0.0–0.1)
Eosinophils Absolute: 0.2 10*3/uL (ref 0.0–0.5)
HCT: 35.8 % (ref 34.8–46.6)
HGB: 12.3 g/dL (ref 11.6–15.9)
LYMPH%: 40.3 % (ref 14.0–49.7)
MCV: 91.6 fL (ref 79.5–101.0)
MONO#: 0.4 10*3/uL (ref 0.1–0.9)
MONO%: 9 % (ref 0.0–14.0)
NEUT#: 1.8 10*3/uL (ref 1.5–6.5)
NEUT%: 45.5 % (ref 38.4–76.8)
Platelets: 166 10*3/uL (ref 145–400)
RBC: 3.91 10*6/uL (ref 3.70–5.45)
WBC: 4 10*3/uL (ref 3.9–10.3)
nRBC: 0 % (ref 0–0)

## 2010-06-26 LAB — COMPREHENSIVE METABOLIC PANEL
ALT: 25 U/L (ref 0–35)
CO2: 25 mEq/L (ref 19–32)
Calcium: 9.1 mg/dL (ref 8.4–10.5)
Chloride: 105 mEq/L (ref 96–112)
Creatinine, Ser: 0.57 mg/dL (ref 0.40–1.20)
Glucose, Bld: 96 mg/dL (ref 70–99)
Total Bilirubin: 0.8 mg/dL (ref 0.3–1.2)

## 2010-06-27 ENCOUNTER — Ambulatory Visit: Payer: Self-pay | Attending: Radiation Oncology | Admitting: Radiation Oncology

## 2010-06-27 DIAGNOSIS — C50919 Malignant neoplasm of unspecified site of unspecified female breast: Secondary | ICD-10-CM | POA: Insufficient documentation

## 2010-06-27 DIAGNOSIS — L589 Radiodermatitis, unspecified: Secondary | ICD-10-CM | POA: Insufficient documentation

## 2010-06-27 DIAGNOSIS — Z510 Encounter for antineoplastic radiation therapy: Secondary | ICD-10-CM | POA: Insufficient documentation

## 2010-06-27 DIAGNOSIS — L509 Urticaria, unspecified: Secondary | ICD-10-CM | POA: Insufficient documentation

## 2010-06-27 DIAGNOSIS — Y842 Radiological procedure and radiotherapy as the cause of abnormal reaction of the patient, or of later complication, without mention of misadventure at the time of the procedure: Secondary | ICD-10-CM | POA: Insufficient documentation

## 2010-07-03 ENCOUNTER — Encounter (HOSPITAL_BASED_OUTPATIENT_CLINIC_OR_DEPARTMENT_OTHER): Payer: Self-pay | Admitting: Oncology

## 2010-07-03 ENCOUNTER — Other Ambulatory Visit: Payer: Self-pay | Admitting: Oncology

## 2010-07-03 ENCOUNTER — Telehealth: Payer: Self-pay | Admitting: Gastroenterology

## 2010-07-03 DIAGNOSIS — C50419 Malignant neoplasm of upper-outer quadrant of unspecified female breast: Secondary | ICD-10-CM

## 2010-07-03 DIAGNOSIS — Z5112 Encounter for antineoplastic immunotherapy: Secondary | ICD-10-CM

## 2010-07-03 LAB — CBC WITH DIFFERENTIAL/PLATELET
BASO%: 0.7 % (ref 0.0–2.0)
Eosinophils Absolute: 0.1 10*3/uL (ref 0.0–0.5)
HCT: 35.4 % (ref 34.8–46.6)
MCHC: 34.5 g/dL (ref 31.5–36.0)
MONO#: 0.2 10*3/uL (ref 0.1–0.9)
NEUT#: 2 10*3/uL (ref 1.5–6.5)
NEUT%: 47.8 % (ref 38.4–76.8)
RBC: 3.9 10*6/uL (ref 3.70–5.45)
WBC: 4.2 10*3/uL (ref 3.9–10.3)
lymph#: 1.8 10*3/uL (ref 0.9–3.3)
nRBC: 0 % (ref 0–0)

## 2010-07-03 NOTE — Telephone Encounter (Signed)
Left message on machine to call back  

## 2010-07-04 NOTE — Telephone Encounter (Signed)
She should retry protonix once daily.  Call in 3-4 weeks to report on her symptoms.

## 2010-07-04 NOTE — Telephone Encounter (Signed)
Pt aware and she will call in 3-4 weeks to give a condition update

## 2010-07-04 NOTE — Telephone Encounter (Signed)
Left message on machine to call back  

## 2010-07-04 NOTE — Telephone Encounter (Signed)
Left message on machine to call back at the new number provided

## 2010-07-04 NOTE — Telephone Encounter (Signed)
Pt is having abd pain that feels like something is in her abd causing her to feel "full"  Has a lot of belching.  She had H pylori in 12/11 she did finish all her antibiotics at that time.  She took protonix for 3 months and then stopped.  She wants to know if she should try the Protonix again or if she should be seen in the office?

## 2010-07-10 ENCOUNTER — Other Ambulatory Visit: Payer: Self-pay | Admitting: Oncology

## 2010-07-10 ENCOUNTER — Encounter (HOSPITAL_BASED_OUTPATIENT_CLINIC_OR_DEPARTMENT_OTHER): Payer: Self-pay | Admitting: Oncology

## 2010-07-10 DIAGNOSIS — Z5112 Encounter for antineoplastic immunotherapy: Secondary | ICD-10-CM

## 2010-07-10 DIAGNOSIS — C50419 Malignant neoplasm of upper-outer quadrant of unspecified female breast: Secondary | ICD-10-CM

## 2010-07-10 LAB — CBC WITH DIFFERENTIAL/PLATELET
Basophils Absolute: 0 10*3/uL (ref 0.0–0.1)
Eosinophils Absolute: 0.1 10*3/uL (ref 0.0–0.5)
HCT: 35.8 % (ref 34.8–46.6)
HGB: 12.4 g/dL (ref 11.6–15.9)
NEUT#: 2.1 10*3/uL (ref 1.5–6.5)
RDW: 12.7 % (ref 11.2–14.5)
lymph#: 1.3 10*3/uL (ref 0.9–3.3)

## 2010-07-11 ENCOUNTER — Other Ambulatory Visit: Payer: Self-pay | Admitting: Radiation Oncology

## 2010-07-11 DIAGNOSIS — C50919 Malignant neoplasm of unspecified site of unspecified female breast: Secondary | ICD-10-CM

## 2010-07-11 DIAGNOSIS — I82629 Acute embolism and thrombosis of deep veins of unspecified upper extremity: Secondary | ICD-10-CM

## 2010-07-12 ENCOUNTER — Ambulatory Visit (HOSPITAL_COMMUNITY)
Admission: RE | Admit: 2010-07-12 | Discharge: 2010-07-12 | Disposition: A | Discharge status: Home / Self Care | Payer: Self-pay | Source: Ambulatory Visit | Attending: Radiation Oncology | Admitting: Radiation Oncology

## 2010-07-12 DIAGNOSIS — M79609 Pain in unspecified limb: Secondary | ICD-10-CM | POA: Insufficient documentation

## 2010-07-12 DIAGNOSIS — M7989 Other specified soft tissue disorders: Secondary | ICD-10-CM | POA: Insufficient documentation

## 2010-07-12 DIAGNOSIS — C50919 Malignant neoplasm of unspecified site of unspecified female breast: Secondary | ICD-10-CM | POA: Insufficient documentation

## 2010-07-12 DIAGNOSIS — R609 Edema, unspecified: Secondary | ICD-10-CM | POA: Insufficient documentation

## 2010-07-17 ENCOUNTER — Encounter (HOSPITAL_BASED_OUTPATIENT_CLINIC_OR_DEPARTMENT_OTHER): Payer: Self-pay | Admitting: Oncology

## 2010-07-17 ENCOUNTER — Other Ambulatory Visit: Payer: Self-pay | Admitting: Physician Assistant

## 2010-07-17 DIAGNOSIS — Z5112 Encounter for antineoplastic immunotherapy: Secondary | ICD-10-CM

## 2010-07-17 DIAGNOSIS — C50419 Malignant neoplasm of upper-outer quadrant of unspecified female breast: Secondary | ICD-10-CM

## 2010-07-17 DIAGNOSIS — K269 Duodenal ulcer, unspecified as acute or chronic, without hemorrhage or perforation: Secondary | ICD-10-CM

## 2010-07-17 DIAGNOSIS — B1910 Unspecified viral hepatitis B without hepatic coma: Secondary | ICD-10-CM

## 2010-07-17 LAB — CBC WITH DIFFERENTIAL/PLATELET
Basophils Absolute: 0 10*3/uL (ref 0.0–0.1)
Eosinophils Absolute: 0.2 10*3/uL (ref 0.0–0.5)
HCT: 35 % (ref 34.8–46.6)
HGB: 12.2 g/dL (ref 11.6–15.9)
LYMPH%: 32.1 % (ref 14.0–49.7)
MONO#: 0.3 10*3/uL (ref 0.1–0.9)
NEUT#: 1.8 10*3/uL (ref 1.5–6.5)
NEUT%: 52.4 % (ref 38.4–76.8)
Platelets: 168 10*3/uL (ref 145–400)
RBC: 3.91 10*6/uL (ref 3.70–5.45)
WBC: 3.5 10*3/uL — ABNORMAL LOW (ref 3.9–10.3)
nRBC: 0 % (ref 0–0)

## 2010-07-17 LAB — COMPREHENSIVE METABOLIC PANEL
ALT: 36 U/L — ABNORMAL HIGH (ref 0–35)
CO2: 29 mEq/L (ref 19–32)
Calcium: 9.1 mg/dL (ref 8.4–10.5)
Chloride: 105 mEq/L (ref 96–112)
Creatinine, Ser: 0.59 mg/dL (ref 0.40–1.20)

## 2010-07-21 ENCOUNTER — Emergency Department (HOSPITAL_COMMUNITY)
Admission: EM | Admit: 2010-07-21 | Discharge: 2010-07-22 | Disposition: A | Discharge status: Home / Self Care | Payer: Self-pay | Attending: Emergency Medicine | Admitting: Emergency Medicine

## 2010-07-21 DIAGNOSIS — L2989 Other pruritus: Secondary | ICD-10-CM | POA: Insufficient documentation

## 2010-07-21 DIAGNOSIS — T7840XA Allergy, unspecified, initial encounter: Secondary | ICD-10-CM | POA: Insufficient documentation

## 2010-07-21 DIAGNOSIS — E538 Deficiency of other specified B group vitamins: Secondary | ICD-10-CM | POA: Insufficient documentation

## 2010-07-21 DIAGNOSIS — C50919 Malignant neoplasm of unspecified site of unspecified female breast: Secondary | ICD-10-CM | POA: Insufficient documentation

## 2010-07-21 DIAGNOSIS — I10 Essential (primary) hypertension: Secondary | ICD-10-CM | POA: Insufficient documentation

## 2010-07-21 DIAGNOSIS — Z9221 Personal history of antineoplastic chemotherapy: Secondary | ICD-10-CM | POA: Insufficient documentation

## 2010-07-21 DIAGNOSIS — R21 Rash and other nonspecific skin eruption: Secondary | ICD-10-CM | POA: Insufficient documentation

## 2010-07-21 DIAGNOSIS — Z923 Personal history of irradiation: Secondary | ICD-10-CM | POA: Insufficient documentation

## 2010-07-21 DIAGNOSIS — L298 Other pruritus: Secondary | ICD-10-CM | POA: Insufficient documentation

## 2010-07-21 LAB — COMPREHENSIVE METABOLIC PANEL
ALT: 30 U/L (ref 0–35)
Alkaline Phosphatase: 84 U/L (ref 39–117)
BUN: 11 mg/dL (ref 6–23)
CO2: 26 mEq/L (ref 19–32)
Calcium: 9.1 mg/dL (ref 8.4–10.5)
GFR calc non Af Amer: >60 mL/min (ref >60)
Glucose, Bld: 92 mg/dL (ref 70–99)
Sodium: 139 mEq/L (ref 135–145)

## 2010-07-21 LAB — CBC
HCT: 35.4 % — ABNORMAL LOW (ref 36.0–46.0)
Hemoglobin: 12.1 g/dL (ref 12.0–15.0)
MCHC: 34.2 g/dL (ref 30.0–36.0)
MCV: 90.3 fL (ref 78.0–100.0)

## 2010-07-21 LAB — DIFFERENTIAL
Basophils Absolute: 0 10*3/uL (ref 0.0–0.1)
Eosinophils Relative: 8 % — ABNORMAL HIGH (ref 0–5)
Lymphocytes Relative: 26 % (ref 12–46)
Lymphs Abs: 1 10*3/uL (ref 0.7–4.0)
Monocytes Absolute: 0.4 10*3/uL (ref 0.1–1.0)
Monocytes Relative: 10 % (ref 3–12)
Neutro Abs: 2 10*3/uL (ref 1.7–7.7)

## 2010-07-24 ENCOUNTER — Other Ambulatory Visit: Payer: Self-pay | Admitting: Physician Assistant

## 2010-07-24 ENCOUNTER — Encounter (HOSPITAL_BASED_OUTPATIENT_CLINIC_OR_DEPARTMENT_OTHER): Payer: Self-pay | Admitting: Oncology

## 2010-07-24 DIAGNOSIS — Z5112 Encounter for antineoplastic immunotherapy: Secondary | ICD-10-CM

## 2010-07-24 DIAGNOSIS — C50419 Malignant neoplasm of upper-outer quadrant of unspecified female breast: Secondary | ICD-10-CM

## 2010-07-24 LAB — CBC WITH DIFFERENTIAL/PLATELET
BASO%: 0.7 % (ref 0.0–2.0)
MCHC: 34.4 g/dL (ref 31.5–36.0)
MONO#: 0.5 10*3/uL (ref 0.1–0.9)
NEUT#: 4.5 10*3/uL (ref 1.5–6.5)
RBC: 3.68 10*6/uL — ABNORMAL LOW (ref 3.70–5.45)
WBC: 6.1 10*3/uL (ref 3.9–10.3)
lymph#: 0.9 10*3/uL (ref 0.9–3.3)
nRBC: 0 % (ref 0–0)

## 2010-07-31 ENCOUNTER — Other Ambulatory Visit: Payer: Self-pay | Admitting: Physician Assistant

## 2010-07-31 ENCOUNTER — Encounter (HOSPITAL_BASED_OUTPATIENT_CLINIC_OR_DEPARTMENT_OTHER): Payer: Self-pay | Admitting: Oncology

## 2010-07-31 DIAGNOSIS — Z5112 Encounter for antineoplastic immunotherapy: Secondary | ICD-10-CM

## 2010-07-31 DIAGNOSIS — C50419 Malignant neoplasm of upper-outer quadrant of unspecified female breast: Secondary | ICD-10-CM

## 2010-07-31 LAB — CBC WITH DIFFERENTIAL/PLATELET
Basophils Absolute: 0.1 10*3/uL (ref 0.0–0.1)
EOS%: 2 % (ref 0.0–7.0)
Eosinophils Absolute: 0.1 10*3/uL (ref 0.0–0.5)
HGB: 12.1 g/dL (ref 11.6–15.9)
MONO#: 0.4 10*3/uL (ref 0.1–0.9)
NEUT#: 3.2 10*3/uL (ref 1.5–6.5)
RDW: 13.2 % (ref 11.2–14.5)
WBC: 4.7 10*3/uL (ref 3.9–10.3)
lymph#: 0.9 10*3/uL (ref 0.9–3.3)

## 2010-07-31 LAB — COMPREHENSIVE METABOLIC PANEL
AST: 23 U/L (ref 0–37)
Albumin: 4.2 g/dL (ref 3.5–5.2)
BUN: 12 mg/dL (ref 6–23)
Calcium: 8.7 mg/dL (ref 8.4–10.5)
Chloride: 105 mEq/L (ref 96–112)
Glucose, Bld: 91 mg/dL (ref 70–99)
Potassium: 4 mEq/L (ref 3.5–5.3)

## 2010-08-07 ENCOUNTER — Other Ambulatory Visit: Payer: Self-pay | Admitting: Physician Assistant

## 2010-08-07 ENCOUNTER — Encounter (HOSPITAL_BASED_OUTPATIENT_CLINIC_OR_DEPARTMENT_OTHER): Payer: Self-pay | Admitting: Oncology

## 2010-08-07 DIAGNOSIS — C50419 Malignant neoplasm of upper-outer quadrant of unspecified female breast: Secondary | ICD-10-CM

## 2010-08-07 DIAGNOSIS — Z5112 Encounter for antineoplastic immunotherapy: Secondary | ICD-10-CM

## 2010-08-07 LAB — CBC WITH DIFFERENTIAL/PLATELET
BASO%: 0.8 % (ref 0.0–2.0)
Basophils Absolute: 0 10*3/uL (ref 0.0–0.1)
EOS%: 2.4 % (ref 0.0–7.0)
HGB: 11.9 g/dL (ref 11.6–15.9)
MCH: 30.8 pg (ref 25.1–34.0)
RDW: 12.5 % (ref 11.2–14.5)
lymph#: 0.7 10*3/uL — ABNORMAL LOW (ref 0.9–3.3)

## 2010-08-14 ENCOUNTER — Other Ambulatory Visit: Payer: Self-pay | Admitting: Physician Assistant

## 2010-08-14 ENCOUNTER — Encounter (HOSPITAL_BASED_OUTPATIENT_CLINIC_OR_DEPARTMENT_OTHER): Payer: Self-pay | Admitting: Oncology

## 2010-08-14 ENCOUNTER — Encounter (INDEPENDENT_AMBULATORY_CARE_PROVIDER_SITE_OTHER): Payer: Self-pay | Admitting: General Surgery

## 2010-08-14 DIAGNOSIS — C50419 Malignant neoplasm of upper-outer quadrant of unspecified female breast: Secondary | ICD-10-CM

## 2010-08-14 DIAGNOSIS — Z5112 Encounter for antineoplastic immunotherapy: Secondary | ICD-10-CM

## 2010-08-14 LAB — CBC WITH DIFFERENTIAL/PLATELET
Eosinophils Absolute: 0.2 10*3/uL (ref 0.0–0.5)
MONO#: 0.4 10*3/uL (ref 0.1–0.9)
NEUT#: 2.9 10*3/uL (ref 1.5–6.5)
Platelets: 185 10*3/uL (ref 145–400)
RBC: 4.01 10*6/uL (ref 3.70–5.45)
RDW: 12.6 % (ref 11.2–14.5)
WBC: 4.1 10*3/uL (ref 3.9–10.3)
lymph#: 0.6 10*3/uL — ABNORMAL LOW (ref 0.9–3.3)

## 2010-08-14 LAB — COMPREHENSIVE METABOLIC PANEL
Albumin: 4 g/dL (ref 3.5–5.2)
CO2: 19 mEq/L (ref 19–32)
Glucose, Bld: 84 mg/dL (ref 70–99)
Potassium: 3.9 mEq/L (ref 3.5–5.3)
Sodium: 141 mEq/L (ref 135–145)
Total Protein: 6.4 g/dL (ref 6.0–8.3)

## 2010-08-21 ENCOUNTER — Ambulatory Visit (HOSPITAL_COMMUNITY)
Admission: RE | Admit: 2010-08-21 | Discharge: 2010-08-21 | Disposition: A | Discharge status: Home / Self Care | Payer: Self-pay | Source: Ambulatory Visit | Attending: Oncology | Admitting: Oncology

## 2010-08-21 DIAGNOSIS — Z09 Encounter for follow-up examination after completed treatment for conditions other than malignant neoplasm: Secondary | ICD-10-CM | POA: Insufficient documentation

## 2010-08-21 DIAGNOSIS — C50919 Malignant neoplasm of unspecified site of unspecified female breast: Secondary | ICD-10-CM | POA: Insufficient documentation

## 2010-09-03 ENCOUNTER — Ambulatory Visit: Payer: Self-pay | Admitting: Physical Therapy

## 2010-09-04 ENCOUNTER — Other Ambulatory Visit: Payer: Self-pay | Admitting: Physician Assistant

## 2010-09-04 ENCOUNTER — Encounter (HOSPITAL_BASED_OUTPATIENT_CLINIC_OR_DEPARTMENT_OTHER): Payer: Self-pay | Admitting: Oncology

## 2010-09-04 DIAGNOSIS — Z5112 Encounter for antineoplastic immunotherapy: Secondary | ICD-10-CM

## 2010-09-04 DIAGNOSIS — C50419 Malignant neoplasm of upper-outer quadrant of unspecified female breast: Secondary | ICD-10-CM

## 2010-09-04 DIAGNOSIS — Z5111 Encounter for antineoplastic chemotherapy: Secondary | ICD-10-CM

## 2010-09-04 LAB — CBC WITH DIFFERENTIAL/PLATELET
BASO%: 0.3 % (ref 0.0–2.0)
Eosinophils Absolute: 0.1 10*3/uL (ref 0.0–0.5)
MCHC: 34.9 g/dL (ref 31.5–36.0)
MCV: 91 fL (ref 79.5–101.0)
MONO#: 0.4 10*3/uL (ref 0.1–0.9)
MONO%: 6.9 % (ref 0.0–14.0)
NEUT#: 4.2 10*3/uL (ref 1.5–6.5)
RBC: 3.64 10*6/uL — ABNORMAL LOW (ref 3.70–5.45)
RDW: 12.9 % (ref 11.2–14.5)
WBC: 5.6 10*3/uL (ref 3.9–10.3)

## 2010-09-04 LAB — COMPREHENSIVE METABOLIC PANEL
ALT: 26 U/L (ref 0–35)
Albumin: 4.1 g/dL (ref 3.5–5.2)
Alkaline Phosphatase: 106 U/L (ref 39–117)
Glucose, Bld: 91 mg/dL (ref 70–99)
Potassium: 4.1 mEq/L (ref 3.5–5.3)
Sodium: 140 mEq/L (ref 135–145)
Total Protein: 6.7 g/dL (ref 6.0–8.3)

## 2010-09-25 ENCOUNTER — Other Ambulatory Visit: Payer: Self-pay | Admitting: Physician Assistant

## 2010-09-25 ENCOUNTER — Encounter (HOSPITAL_BASED_OUTPATIENT_CLINIC_OR_DEPARTMENT_OTHER): Payer: Self-pay | Admitting: Oncology

## 2010-09-25 DIAGNOSIS — Z5112 Encounter for antineoplastic immunotherapy: Secondary | ICD-10-CM

## 2010-09-25 DIAGNOSIS — C50419 Malignant neoplasm of upper-outer quadrant of unspecified female breast: Secondary | ICD-10-CM

## 2010-09-25 DIAGNOSIS — Z5111 Encounter for antineoplastic chemotherapy: Secondary | ICD-10-CM

## 2010-09-25 LAB — CBC WITH DIFFERENTIAL/PLATELET
BASO%: 0.5 % (ref 0.0–2.0)
EOS%: 1.4 % (ref 0.0–7.0)
Eosinophils Absolute: 0.1 10*3/uL (ref 0.0–0.5)
LYMPH%: 23.8 % (ref 14.0–49.7)
MCH: 30.1 pg (ref 25.1–34.0)
MCHC: 33.7 g/dL (ref 31.5–36.0)
MCV: 89.2 fL (ref 79.5–101.0)
MONO%: 6.2 % (ref 0.0–14.0)
NEUT#: 4 10*3/uL (ref 1.5–6.5)
Platelets: 234 10*3/uL (ref 145–400)
RBC: 3.72 10*6/uL (ref 3.70–5.45)
RDW: 13 % (ref 11.2–14.5)
nRBC: 0 % (ref 0–0)

## 2010-09-25 LAB — COMPREHENSIVE METABOLIC PANEL
ALT: 20 U/L (ref 0–35)
Alkaline Phosphatase: 110 U/L (ref 39–117)
Creatinine, Ser: 0.53 mg/dL (ref 0.50–1.10)
Sodium: 135 mEq/L (ref 135–145)
Total Bilirubin: 0.3 mg/dL (ref 0.3–1.2)
Total Protein: 7.2 g/dL (ref 6.0–8.3)

## 2010-10-18 ENCOUNTER — Other Ambulatory Visit: Payer: Self-pay | Admitting: Physician Assistant

## 2010-10-18 ENCOUNTER — Encounter (HOSPITAL_BASED_OUTPATIENT_CLINIC_OR_DEPARTMENT_OTHER): Payer: Self-pay | Admitting: Oncology

## 2010-10-18 DIAGNOSIS — Z5112 Encounter for antineoplastic immunotherapy: Secondary | ICD-10-CM

## 2010-10-18 DIAGNOSIS — C50419 Malignant neoplasm of upper-outer quadrant of unspecified female breast: Secondary | ICD-10-CM

## 2010-10-18 LAB — CBC WITH DIFFERENTIAL/PLATELET
BASO%: 0.4 % (ref 0.0–2.0)
Eosinophils Absolute: 0.1 10*3/uL (ref 0.0–0.5)
HCT: 31.9 % — ABNORMAL LOW (ref 34.8–46.6)
LYMPH%: 26.5 % (ref 14.0–49.7)
MCHC: 33.9 g/dL (ref 31.5–36.0)
MCV: 88.6 fL (ref 79.5–101.0)
MONO#: 0.3 10*3/uL (ref 0.1–0.9)
MONO%: 6.9 % (ref 0.0–14.0)
NEUT%: 64.7 % (ref 38.4–76.8)
Platelets: 231 10*3/uL (ref 145–400)
RBC: 3.6 10*6/uL — ABNORMAL LOW (ref 3.70–5.45)
WBC: 4.6 10*3/uL (ref 3.9–10.3)
nRBC: 0 % (ref 0–0)

## 2010-10-18 LAB — COMPREHENSIVE METABOLIC PANEL
BUN: 15 mg/dL (ref 6–23)
CO2: 26 mEq/L (ref 19–32)
Calcium: 9.5 mg/dL (ref 8.4–10.5)
Chloride: 105 mEq/L (ref 96–112)
Creatinine, Ser: 0.71 mg/dL (ref 0.50–1.10)
Total Bilirubin: 0.3 mg/dL (ref 0.3–1.2)

## 2010-11-05 ENCOUNTER — Ambulatory Visit: Payer: Self-pay | Admitting: Physical Therapy

## 2010-11-08 ENCOUNTER — Ambulatory Visit
Admission: RE | Admit: 2010-11-08 | Discharge: 2010-11-08 | Disposition: A | Discharge status: Home / Self Care | Payer: Self-pay | Source: Ambulatory Visit | Attending: Radiation Oncology | Admitting: Radiation Oncology

## 2010-11-08 ENCOUNTER — Other Ambulatory Visit: Payer: Self-pay | Admitting: Physician Assistant

## 2010-11-08 ENCOUNTER — Encounter (HOSPITAL_BASED_OUTPATIENT_CLINIC_OR_DEPARTMENT_OTHER): Payer: Self-pay | Admitting: Oncology

## 2010-11-08 DIAGNOSIS — Z5112 Encounter for antineoplastic immunotherapy: Secondary | ICD-10-CM

## 2010-11-08 DIAGNOSIS — C50419 Malignant neoplasm of upper-outer quadrant of unspecified female breast: Secondary | ICD-10-CM

## 2010-11-08 LAB — CBC WITH DIFFERENTIAL/PLATELET
Basophils Absolute: 0 10*3/uL (ref 0.0–0.1)
Eosinophils Absolute: 0.1 10*3/uL (ref 0.0–0.5)
HCT: 31.2 % — ABNORMAL LOW (ref 34.8–46.6)
HGB: 10.4 g/dL — ABNORMAL LOW (ref 11.6–15.9)
LYMPH%: 24.6 % (ref 14.0–49.7)
MONO#: 0.3 10*3/uL (ref 0.1–0.9)
NEUT%: 67.2 % (ref 38.4–76.8)
Platelets: 193 10*3/uL (ref 145–400)
WBC: 4.2 10*3/uL (ref 3.9–10.3)
lymph#: 1 10*3/uL (ref 0.9–3.3)

## 2010-11-08 LAB — COMPREHENSIVE METABOLIC PANEL
ALT: 17 U/L (ref 0–35)
BUN: 13 mg/dL (ref 6–23)
CO2: 24 mEq/L (ref 19–32)
Calcium: 8.6 mg/dL (ref 8.4–10.5)
Chloride: 109 mEq/L (ref 96–112)
Creatinine, Ser: 0.76 mg/dL (ref 0.50–1.10)
Glucose, Bld: 104 mg/dL — ABNORMAL HIGH (ref 70–99)

## 2010-11-27 ENCOUNTER — Encounter (HOSPITAL_BASED_OUTPATIENT_CLINIC_OR_DEPARTMENT_OTHER): Payer: Self-pay | Admitting: Oncology

## 2010-11-27 ENCOUNTER — Ambulatory Visit (HOSPITAL_COMMUNITY)
Admission: RE | Admit: 2010-11-27 | Discharge: 2010-11-27 | Disposition: A | Discharge status: Home / Self Care | Payer: Self-pay | Source: Ambulatory Visit | Attending: Oncology | Admitting: Oncology

## 2010-11-27 ENCOUNTER — Other Ambulatory Visit: Payer: Self-pay | Admitting: Physician Assistant

## 2010-11-27 DIAGNOSIS — C50419 Malignant neoplasm of upper-outer quadrant of unspecified female breast: Secondary | ICD-10-CM

## 2010-11-27 DIAGNOSIS — C50919 Malignant neoplasm of unspecified site of unspecified female breast: Secondary | ICD-10-CM | POA: Insufficient documentation

## 2010-11-27 DIAGNOSIS — M7989 Other specified soft tissue disorders: Secondary | ICD-10-CM | POA: Insufficient documentation

## 2010-11-27 DIAGNOSIS — Z5112 Encounter for antineoplastic immunotherapy: Secondary | ICD-10-CM

## 2010-11-27 DIAGNOSIS — Z79899 Other long term (current) drug therapy: Secondary | ICD-10-CM | POA: Insufficient documentation

## 2010-11-27 LAB — COMPREHENSIVE METABOLIC PANEL
ALT: 23 U/L (ref 0–35)
AST: 29 U/L (ref 0–37)
Albumin: 4.2 g/dL (ref 3.5–5.2)
BUN: 12 mg/dL (ref 6–23)
CO2: 23 mEq/L (ref 19–32)
Calcium: 8.9 mg/dL (ref 8.4–10.5)
Chloride: 105 mEq/L (ref 96–112)
Potassium: 4 mEq/L (ref 3.5–5.3)

## 2010-11-27 LAB — CBC WITH DIFFERENTIAL/PLATELET
BASO%: 0.6 % (ref 0.0–2.0)
Eosinophils Absolute: 0.1 10*3/uL (ref 0.0–0.5)
HCT: 33.5 % — ABNORMAL LOW (ref 34.8–46.6)
HGB: 11.3 g/dL — ABNORMAL LOW (ref 11.6–15.9)
MCHC: 33.7 g/dL (ref 31.5–36.0)
MONO#: 0.4 10*3/uL (ref 0.1–0.9)
NEUT#: 3.2 10*3/uL (ref 1.5–6.5)
NEUT%: 61 % (ref 38.4–76.8)
WBC: 5.2 10*3/uL (ref 3.9–10.3)
lymph#: 1.5 10*3/uL (ref 0.9–3.3)
nRBC: 0 % (ref 0–0)

## 2010-12-05 ENCOUNTER — Ambulatory Visit: Payer: Self-pay | Admitting: Physical Therapy

## 2010-12-13 ENCOUNTER — Ambulatory Visit: Payer: Self-pay | Admitting: Gastroenterology

## 2010-12-17 ENCOUNTER — Encounter: Payer: Self-pay | Admitting: Obstetrics and Gynecology

## 2010-12-17 ENCOUNTER — Ambulatory Visit: Payer: Self-pay | Attending: Oncology | Admitting: Physical Therapy

## 2010-12-17 ENCOUNTER — Ambulatory Visit (INDEPENDENT_AMBULATORY_CARE_PROVIDER_SITE_OTHER): Payer: Self-pay | Admitting: Obstetrics and Gynecology

## 2010-12-17 VITALS — BP 107/66 | HR 75 | Temp 98.5°F | Wt 132.8 lb

## 2010-12-17 DIAGNOSIS — Z01419 Encounter for gynecological examination (general) (routine) without abnormal findings: Secondary | ICD-10-CM

## 2010-12-17 DIAGNOSIS — IMO0001 Reserved for inherently not codable concepts without codable children: Secondary | ICD-10-CM | POA: Insufficient documentation

## 2010-12-17 DIAGNOSIS — I890 Lymphedema, not elsewhere classified: Secondary | ICD-10-CM | POA: Insufficient documentation

## 2010-12-17 NOTE — Progress Notes (Signed)
S: Pt presents today for a yearly pap and pe. She states she is doing well and has no complaints. She has a recent hx of invasive breast CA and is currently being followed by GYN oncology.   O: VSS afebrile A&Ox3 in NAD HEENT essentially NL Heart RRR without murmur or gallop LCTAB Abd soft, nontender to exam. Breast exam: Rt breast essentially NL with no nodules or masses noted. No nipple dc. Pt nontender on exam.  Lt breast is s/p radical mastectomy. Incision has healed nicely. No palpable nodules or masses noted. Pt nontender on exam. NL external female genitalia. Cervix easily visualized. Uterus NL size and shape. No adnexal masses noted. Pt nontender on exam.  A/P: Pap and PE: discussed with pt at length via interpreter. She will return in 33yr or prn. She will continue to f/u with her GYN oncologist for f/u mammograms and continued tx.    Clinton Gallant. Rice III, DrHSc, MPAS, PA-C

## 2010-12-18 ENCOUNTER — Other Ambulatory Visit: Payer: Self-pay | Admitting: Oncology

## 2010-12-18 ENCOUNTER — Encounter (HOSPITAL_BASED_OUTPATIENT_CLINIC_OR_DEPARTMENT_OTHER): Payer: Self-pay | Admitting: Oncology

## 2010-12-18 ENCOUNTER — Other Ambulatory Visit: Payer: Self-pay | Admitting: Physician Assistant

## 2010-12-18 DIAGNOSIS — C50419 Malignant neoplasm of upper-outer quadrant of unspecified female breast: Secondary | ICD-10-CM

## 2010-12-18 DIAGNOSIS — Z5112 Encounter for antineoplastic immunotherapy: Secondary | ICD-10-CM

## 2010-12-18 DIAGNOSIS — Z853 Personal history of malignant neoplasm of breast: Secondary | ICD-10-CM

## 2010-12-18 DIAGNOSIS — Z1231 Encounter for screening mammogram for malignant neoplasm of breast: Secondary | ICD-10-CM

## 2010-12-18 DIAGNOSIS — Z5111 Encounter for antineoplastic chemotherapy: Secondary | ICD-10-CM

## 2010-12-18 LAB — CBC WITH DIFFERENTIAL/PLATELET
BASO%: 0.4 % (ref 0.0–2.0)
EOS%: 2.9 % (ref 0.0–7.0)
MCH: 27.9 pg (ref 25.1–34.0)
MCHC: 33 g/dL (ref 31.5–36.0)
RDW: 13.6 % (ref 11.2–14.5)
lymph#: 1.2 10*3/uL (ref 0.9–3.3)

## 2010-12-18 LAB — COMPREHENSIVE METABOLIC PANEL
ALT: 20 U/L (ref 0–35)
AST: 26 U/L (ref 0–37)
Albumin: 3.7 g/dL (ref 3.5–5.2)
Calcium: 9.2 mg/dL (ref 8.4–10.5)
Chloride: 104 mEq/L (ref 96–112)
Potassium: 4 mEq/L (ref 3.5–5.3)
Sodium: 137 mEq/L (ref 135–145)

## 2010-12-21 ENCOUNTER — Other Ambulatory Visit: Payer: Self-pay | Admitting: Oncology

## 2010-12-21 ENCOUNTER — Ambulatory Visit
Admission: RE | Admit: 2010-12-21 | Discharge: 2010-12-21 | Disposition: A | Discharge status: Home / Self Care | Payer: Self-pay | Source: Ambulatory Visit | Attending: Oncology | Admitting: Oncology

## 2010-12-21 DIAGNOSIS — Z1231 Encounter for screening mammogram for malignant neoplasm of breast: Secondary | ICD-10-CM

## 2010-12-21 DIAGNOSIS — Z853 Personal history of malignant neoplasm of breast: Secondary | ICD-10-CM

## 2011-01-08 ENCOUNTER — Encounter: Payer: Self-pay | Admitting: Gastroenterology

## 2011-01-08 ENCOUNTER — Encounter (HOSPITAL_BASED_OUTPATIENT_CLINIC_OR_DEPARTMENT_OTHER): Payer: Self-pay | Admitting: Oncology

## 2011-01-08 ENCOUNTER — Other Ambulatory Visit: Payer: Self-pay | Admitting: Physician Assistant

## 2011-01-08 DIAGNOSIS — C50419 Malignant neoplasm of upper-outer quadrant of unspecified female breast: Secondary | ICD-10-CM

## 2011-01-08 DIAGNOSIS — Z5111 Encounter for antineoplastic chemotherapy: Secondary | ICD-10-CM

## 2011-01-08 DIAGNOSIS — Z5112 Encounter for antineoplastic immunotherapy: Secondary | ICD-10-CM

## 2011-01-08 LAB — COMPREHENSIVE METABOLIC PANEL
ALT: 20 U/L (ref 0–35)
AST: 27 U/L (ref 0–37)
Alkaline Phosphatase: 94 U/L (ref 39–117)
Calcium: 9.3 mg/dL (ref 8.4–10.5)
Chloride: 107 mEq/L (ref 96–112)
Creatinine, Ser: 0.68 mg/dL (ref 0.50–1.10)

## 2011-01-08 LAB — CBC WITH DIFFERENTIAL/PLATELET
BASO%: 0.2 % (ref 0.0–2.0)
EOS%: 0.7 % (ref 0.0–7.0)
HCT: 32.4 % — ABNORMAL LOW (ref 34.8–46.6)
MCH: 27.6 pg (ref 25.1–34.0)
MCHC: 33 g/dL (ref 31.5–36.0)
NEUT%: 71.3 % (ref 38.4–76.8)
RDW: 14.2 % (ref 11.2–14.5)
lymph#: 1.2 10*3/uL (ref 0.9–3.3)

## 2011-01-10 ENCOUNTER — Other Ambulatory Visit: Payer: Self-pay | Admitting: Gastroenterology

## 2011-01-10 ENCOUNTER — Ambulatory Visit (INDEPENDENT_AMBULATORY_CARE_PROVIDER_SITE_OTHER): Payer: Self-pay | Admitting: Gastroenterology

## 2011-01-10 DIAGNOSIS — B181 Chronic viral hepatitis B without delta-agent: Secondary | ICD-10-CM

## 2011-01-10 LAB — HEPATITIS B SURFACE ANTIGEN: Hepatitis B Surface Ag: POSITIVE — AB

## 2011-01-10 LAB — HEPATITIS B SURFACE ANTIBODY,QUALITATIVE: Hep B S Ab: NEGATIVE

## 2011-01-11 ENCOUNTER — Ambulatory Visit (INDEPENDENT_AMBULATORY_CARE_PROVIDER_SITE_OTHER): Payer: Self-pay | Admitting: Surgery

## 2011-01-11 ENCOUNTER — Encounter (INDEPENDENT_AMBULATORY_CARE_PROVIDER_SITE_OTHER): Payer: Self-pay | Admitting: Surgery

## 2011-01-11 VITALS — BP 98/62 | HR 66 | Temp 97.8°F | Resp 16 | Ht 64.5 in | Wt 133.4 lb

## 2011-01-11 DIAGNOSIS — Z853 Personal history of malignant neoplasm of breast: Secondary | ICD-10-CM

## 2011-01-11 LAB — AFP TUMOR MARKER: AFP-Tumor Marker: 1.6 ng/mL (ref 0.0–8.0)

## 2011-01-11 NOTE — Progress Notes (Signed)
Subjective:     Patient ID: Lauren Cohen, female   DOB: 03-13-1971, 40 y.o.   MRN: [id number]  HPI The patient returns today for breast cancer followup. She's doing well. She is returned for her. She does have discomfort around her left Mastectomy site.  Review of Systems  Constitutional: Negative.   HENT: Negative.   Respiratory: Negative.        Objective:   Physical Exam  Constitutional: She appears well-developed and well-nourished.  HENT:  Head: Normocephalic and atraumatic.  Eyes: EOM are normal. Pupils are equal, round, and reactive to light.  Neck: Normal range of motion. Neck supple.  Pulmonary/Chest:       Left breast surgically absent. Implant in place. Scar tissue noted. Right breast normal. Right subclavian Port-A-Cath noted       Assessment:     Status post left modified radical mastectomy for breast cancer stage II    Plan:     She is doing well. She is doing chemotherapy. We will set her up for port removal. Risk of bleeding, infection, fragmentation and Vein injury.She agrees to proceed.

## 2011-01-11 NOTE — Patient Instructions (Signed)
You will be scheduled for port removal. 

## 2011-01-14 LAB — HEPATITIS B E ANTIGEN: Hepatitis Be Antigen: POSITIVE — AB

## 2011-01-15 LAB — HEPATITIS B DNA, ULTRAQUANTITATIVE, PCR
Hepatitis B DNA (Calc): <116 copies/mL (ref <116)
Hepatitis B DNA: <20 IU/mL (ref <20)

## 2011-01-18 NOTE — Progress Notes (Signed)
Lauren Cohen, Lauren Cohen  MR#:  [id number]      DATE:  01/10/2011  DOB:  1971/02/06    cc: Consulting Physicians:  Henrietta Hoover, PA c/o Elsie Lincoln, MD, [address] [place], [place] [address], Phone [phone]  Rob Bunting, MD, [facility], [address] [place], [address] Floor, [place], [place] [address], Fax [phone]  Primary Care Physician:  None.  Referring Physician:  Beatris Ship, MD, [facility], [facility], [address] [place], [place] [address], Fax [phone]    REASON FOR VISIT:  Follow up of genotype B, E antigen positive hepatitis B.   History:  The patient returns today accompanied by a Nurse, learning disability. She reports that she finished her final course of chemotherapy last Tuesday. She is still on tenofovir through patient assistance through [place]. She has not experienced any side effects such as a skin rash. There are no symptoms of active hepatitis B nor are there symptoms to suggest decompensated hepatitis B or vasculitis.   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   CURRENT MEDICATIONS:  Tenofovir 300 mg p.o. daily.   ALLERGIES:  None.    Habits:  Smoking, never. Alcohol denies interval consumption.   REVIEW OF SYSTEMS:  All 10 systems reviewed today with the patient via translator. She complains of epigastric pain for which I see there are notes from Dr. Christella Hartigan in Epic suggesting Protonix.  Her CES-D is 13.   PHYSICAL EXAMINATION:   Constitutional:  Well appearing. Anicteric without stigmata of chronic liver disease. Vital signs: Height 64.5 inches, weight 134 pounds, blood pressure 117/67, pulse of 69, temperature 97.5  Fahrenheit. Ears, nose, mouth and throat:  Unremarkable oropharynx.  No thyromegaly or  neck masses.  Chest:  Resonant to percussion.  Clear to auscultation.  Cardiovascular:  Heart sounds normal S1, S2 without  murmurs or rubs.  There is no peripheral edema.  Abdominal:  Normal bowel sounds.  No masses.  There is some epigastric tenderness without guarding or rebound.  I could not appreciate a liver edge or spleen  tip.  I could not appreciate any hernias.  Lymphatics:  No cervical or inguinal lymphadenopathy.  Central Nervous System:  No asterixis or focal neurologic findings.  Dermatologic:  Anicteric without palmar  erythema or spider angiomata.  Eyes:  Anicteric sclerae.  Pupils are equal and reactive to light.   LABORATORY:  Most recent labs from 01/08/2011, her creatinine was 0.68, AST 27, ALT 20, ALP 94, total bilirubin 0.4, albumin 4.2. Her globulins were 2.7.  Last imaging data was an ultrasound of the liver on 05/21/2010, which was unremarkable.   ASSESSMENT:  The patient is a 40 year old woman with a history of genotype B, E antigen positive and hepatitis B who has now been on tenofovir since 03/06/2010, or approximately 44 weeks to suppress virus while on  chemotherapy for breast cancer. Considering that she is E antigen positive, it may be worth continuing on therapy to achieve an E antigen to E antibody seroconversion. In addition, at minimum, I would like to continue on therapy for at least 6 months after the discontinuation of her Herceptin chemotherapy for breast cancer. She appears to be tolerating HBV therapy well.  In terms of her hepatitis B care, she does not strictly speaking need imaging  of the liver until the age of 63 for hepatocellular cancer (HCC) screening. She is hepatitis A immune.  In my discussion today with the patient via the translator, we discussed course of therapy to date. I have explained I would like to  keep her on therapy for at least another 6 months after discontinuation of the Herceptin.   PLAN:  1. Will draw phosphorus today. All the  labs that she had in the last few days, will use as part of her follow up of her hepatitis B. 2. Will check hepatitis B DNA and hepatitis B serologies. 3. She is to return in approximately 4-6 months' time for follow up on therapy. 4. I filled out the paperwork to renew her [employer] for her and written a new prescription for tenofovir 300 mg p.o. daily, 30 days supply with 5 refills.            Brooke Dare, MD   ADDENDUM:  HBV DNA detectable but < 20 IU/mL.  Pretreatment was 86578 IU/mL on 03/01/10    E Ag positive.  403 .[id number]  D:  Thu Oct 11 17:17:53 2012 ; T:  Thu Oct 11 18:32:58 2012  Job #:  [id number]

## 2011-01-28 ENCOUNTER — Encounter (INDEPENDENT_AMBULATORY_CARE_PROVIDER_SITE_OTHER): Payer: Self-pay | Admitting: Surgery

## 2011-01-28 ENCOUNTER — Ambulatory Visit (INDEPENDENT_AMBULATORY_CARE_PROVIDER_SITE_OTHER): Payer: Self-pay | Admitting: Surgery

## 2011-01-28 VITALS — BP 114/72 | HR 72 | Temp 97.6°F | Resp 16 | Ht 64.5 in | Wt 134.4 lb

## 2011-01-28 DIAGNOSIS — Z853 Personal history of malignant neoplasm of breast: Secondary | ICD-10-CM

## 2011-01-28 NOTE — Progress Notes (Signed)
Subjective:     Patient ID: Lauren Cohen, female   DOB: 1970/09/06, 40 y.o.   MRN: [id number]  HPI The patient returns today for breast cancer followup. She's doing well. She is returned for her. She does have discomfort around her left Mastectomy site.  Review of Systems  Constitutional: Negative.   HENT: Negative.   Respiratory: Negative.        Objective:   Physical Exam  Constitutional: She appears well-developed and well-nourished.  HENT:  Head: Normocephalic and atraumatic.  Eyes: EOM are normal. Pupils are equal, round, and reactive to light.  Neck: Normal range of motion. Neck supple.  Pulmonary/Chest:       Left breast surgically absent. Implant in place. Scar tissue noted central upper breast. Right breast normal. Right subclavian Port-A-Cath noted       Assessment:     Status post left modified radical mastectomy for breast cancer stage II    Plan:     She is doing well. She is doing chemotherapy. We will set her up for port removal. Risk of bleeding, infection, fragmentation and Vein injury.She agrees to proceed.  May need MRI to evaluate tissue in left breast if this persists or gets larger.

## 2011-01-28 NOTE — Patient Instructions (Signed)
You will be scheduled for port removal. 

## 2011-01-30 ENCOUNTER — Other Ambulatory Visit (HOSPITAL_COMMUNITY): Payer: Self-pay

## 2011-02-01 ENCOUNTER — Encounter (HOSPITAL_COMMUNITY): Payer: Self-pay | Admitting: *Deleted

## 2011-02-02 ENCOUNTER — Encounter (HOSPITAL_COMMUNITY): Payer: Self-pay | Admitting: Surgery

## 2011-02-02 NOTE — H&P (Addendum)
[name]  Description:  40 year old female  01/28/2011 10:20 AM Office Visit Provider:  Dortha Schwalbe., MD  MRN: [id number] Department:  [facility]            Diagnoses  Reason for Visit    History of breast cancer - Primary  Routine Post Op   V10.3  PO reck PAC for removal           Vitals - Last Recorded       BP  Pulse  Temp(Src)  Resp  Ht  Wt    114/72  72  97.6 F (36.4 C) (Temporal)  16  5' 4.5" (1.638 m)  134 lb 6.4 oz (60.963 kg)           BMI  LMP             22.71 kg/m2  01/28/2011                  Progress Notes     Jarin Cornfield A., MD 01/28/2011 11:17 AM Signed    Subjective:    Patient ID: Lauren Cohen, female DOB: [birth year], 40 y.o. MRN: [id number]  HPI  The patient returns today for breast cancer followup. She's doing well. She is returned for her. She does have discomfort around her left Mastectomy site. Past Surgical History  Procedure Date  . Left modified mastectomy 2011  . Portacath placement    History   Social History  . Marital Status: Married    Spouse Name: N/A    Number of Children: N/A  . Years of Education: N/A   Occupational History  . Not on file.   Social History Main Topics  . Smoking status: Never Smoker   . Smokeless tobacco: Never Used  . Alcohol Use: No  . Drug Use: No  . Sexually Active: Yes    Birth Control/ Protection: Inserts   Other Topics Concern  . Not on file   Social History Narrative  . No narrative on file    Family History  Problem Relation Age of Onset  . Hepatitis Mother    No current facility-administered medications on file prior to encounter.   Current Outpatient Prescriptions on File Prior to Encounter  Medication Sig Dispense Refill  . tenofovir (VIREAD) 300 MG tablet Take 300 mg by mouth daily.        No Known Allergies Review of Systems  Constitutional: Negative.  HENT: Negative.  Respiratory: Negative.  Lymph: negative Past Medical History  Diagnosis  Date  . Hepatitis B infection   . Breast cancer 2011  . 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 Duodenal ulcer   . S/P left mastectomy 2011  . Peptic ulcer disease   . Gastritis   . Anemia   . GI bleed      Objective:    Physical Exam  Constitutional: She appears well-developed and well-nourished.  HENT:  Head: Normocephalic and atraumatic.  Eyes: EOM are normal. Pupils are equal, round, and reactive to light.  Neck: Normal range of motion. Neck supple.  Pulmonary/Chest:  Left breast surgically absent. Implant in place. Scar tissue noted central upper breast. Right breast normal. Right subclavian Port-A-Cath noted  Extremities: normal CV:  NORMAL Abd: normal    Assessment:     Status post left modified radical mastectomy for breast cancer stage II  Indwelling Port    Plan:     She is doing well. She is done  with chemotherapy. We will set her up for port removal. Risk of bleeding, infection, fragmentation and Vein injury.She agrees to proceed. May need MRI to evaluate tissue in left breast if this persists or gets larger.              Not recorded                            Patient Instructions     You will be scheduled for port removal.          Level of Service  Follow-up and Disposition    PR OFFICE/OUTPT VISIT,EST,LEVL II [24401]  Return in about 5 weeks (around 03/06/2011).           All Flowsheet Templates (all recorded)     Encounter Vitals Flowsheet   Custom Formula Data Flowsheet   Anthropometrics Flowsheet                                      All Charges for This Encounter       Code  Description  Service Date  Service Provider  Modifiers  Quantity    [phone]  PR OFFICE/OUTPT VISIT,EST,LEVL II  01/28/2011  Clovis Pu. Koven Belinsky, MD   1                Other Encounter Related Information     Allergies & Medications      Problem List      History      Patient-Entered Questionnaires       No data filed

## 2011-02-03 MED ORDER — CEFAZOLIN SODIUM 1-5 GM-% IV SOLN
1.0000 g | INTRAVENOUS | Status: DC
  Filled 2011-02-03: qty 50

## 2011-02-04 ENCOUNTER — Encounter (HOSPITAL_COMMUNITY)
Admission: RE | Disposition: A | Discharge status: Home / Self Care | Payer: Self-pay | Source: Ambulatory Visit | Attending: Surgery

## 2011-02-04 ENCOUNTER — Ambulatory Visit (HOSPITAL_COMMUNITY)
Admission: RE | Admit: 2011-02-04 | Discharge: 2011-02-04 | Disposition: A | Discharge status: Home / Self Care | Payer: Self-pay | Source: Ambulatory Visit | Attending: Surgery | Admitting: Surgery

## 2011-02-04 ENCOUNTER — Encounter (HOSPITAL_COMMUNITY): Payer: Self-pay | Admitting: Certified Registered Nurse Anesthetist

## 2011-02-04 ENCOUNTER — Encounter (HOSPITAL_COMMUNITY): Payer: Self-pay | Admitting: Surgery

## 2011-02-04 ENCOUNTER — Ambulatory Visit (HOSPITAL_COMMUNITY): Payer: Self-pay | Admitting: Certified Registered Nurse Anesthetist

## 2011-02-04 DIAGNOSIS — Z79899 Other long term (current) drug therapy: Secondary | ICD-10-CM | POA: Insufficient documentation

## 2011-02-04 DIAGNOSIS — Z452 Encounter for adjustment and management of vascular access device: Secondary | ICD-10-CM | POA: Insufficient documentation

## 2011-02-04 DIAGNOSIS — Z853 Personal history of malignant neoplasm of breast: Secondary | ICD-10-CM

## 2011-02-04 HISTORY — PX: PORT-A-CATH REMOVAL: SHX5289

## 2011-02-04 LAB — SURGICAL PCR SCREEN: MRSA, PCR: NEGATIVE

## 2011-02-04 LAB — DIFFERENTIAL
Basophils Relative: 0 % (ref 0–1)
Eosinophils Absolute: 0 10*3/uL (ref 0.0–0.7)
Monocytes Absolute: 0.2 10*3/uL (ref 0.1–1.0)
Monocytes Relative: 5 % (ref 3–12)

## 2011-02-04 LAB — CBC
HCT: 35.3 % — ABNORMAL LOW (ref 36.0–46.0)
Hemoglobin: 11.5 g/dL — ABNORMAL LOW (ref 12.0–15.0)
MCH: 27.5 pg (ref 26.0–34.0)
MCHC: 32.6 g/dL (ref 30.0–36.0)
MCV: 84.4 fL (ref 78.0–100.0)

## 2011-02-04 LAB — HCG, SERUM, QUALITATIVE: Preg, Serum: NEGATIVE

## 2011-02-04 LAB — COMPREHENSIVE METABOLIC PANEL
Albumin: 3.7 g/dL (ref 3.5–5.2)
BUN: 11 mg/dL (ref 6–23)
Creatinine, Ser: 0.71 mg/dL (ref 0.50–1.10)
GFR calc Af Amer: >90 mL/min (ref >90)
Glucose, Bld: 99 mg/dL (ref 70–99)
Total Bilirubin: 0.4 mg/dL (ref 0.3–1.2)
Total Protein: 7.1 g/dL (ref 6.0–8.3)

## 2011-02-04 SURGERY — REMOVAL PORT-A-CATH
Anesthesia: Choice | Wound class: Clean

## 2011-02-04 MED ORDER — CEFAZOLIN SODIUM 1-5 GM-% IV SOLN
INTRAVENOUS | Status: DC | PRN
  Administered 2011-02-04: 1 g via INTRAVENOUS

## 2011-02-04 MED ORDER — HYDROMORPHONE HCL PF 1 MG/ML IJ SOLN: 0.2500 mg | INTRAMUSCULAR | Status: DC | PRN

## 2011-02-04 MED ORDER — PROPOFOL 10 MG/ML IV EMUL
INTRAVENOUS | Status: DC | PRN
  Administered 2011-02-04: 75 ug/kg/min via INTRAVENOUS

## 2011-02-04 MED ORDER — FENTANYL CITRATE 0.05 MG/ML IJ SOLN
INTRAMUSCULAR | Status: DC | PRN
  Administered 2011-02-04 (×2): 25 ug via INTRAVENOUS

## 2011-02-04 MED ORDER — ONDANSETRON HCL 4 MG/2ML IJ SOLN
INTRAMUSCULAR | Status: DC | PRN
  Administered 2011-02-04: 4 mg via INTRAVENOUS

## 2011-02-04 MED ORDER — LACTATED RINGERS IV SOLN
INTRAVENOUS | Status: DC | PRN
  Administered 2011-02-04: 12:00:00 via INTRAVENOUS

## 2011-02-04 MED ORDER — ONDANSETRON HCL 4 MG/2ML IJ SOLN: 4.0000 mg | Freq: Once | INTRAMUSCULAR | Status: DC | PRN

## 2011-02-04 MED ORDER — MIDAZOLAM HCL 5 MG/5ML IJ SOLN
INTRAMUSCULAR | Status: DC | PRN
  Administered 2011-02-04: 2 mg via INTRAVENOUS

## 2011-02-04 MED ORDER — LACTATED RINGERS IV SOLN: INTRAVENOUS | Status: DC

## 2011-02-04 MED ORDER — OXYCODONE-ACETAMINOPHEN 10-325 MG PO TABS: 1.0000 | ORAL_TABLET | Freq: Four times a day (QID) | ORAL | Status: DC | PRN

## 2011-02-04 MED ORDER — MUPIROCIN 2 % EX OINT
TOPICAL_OINTMENT | CUTANEOUS | Status: AC
  Filled 2011-02-04: qty 22

## 2011-02-04 MED ORDER — BUPIVACAINE-EPINEPHRINE 0.25% -1:200000 IJ SOLN
INTRAMUSCULAR | Status: DC | PRN
  Administered 2011-02-04: 10 mL

## 2011-02-04 SURGICAL SUPPLY — 40 items
ADH SKN CLS APL DERMABOND .7 (GAUZE/BANDAGES/DRESSINGS) ×1
BLADE SURG 15 STRL LF DISP TIS (BLADE) ×1 IMPLANT
BLADE SURG 15 STRL SS (BLADE) ×2
CANISTER SUCTION 2500CC (MISCELLANEOUS) IMPLANT
CHLORAPREP W/TINT 10.5 ML (MISCELLANEOUS) ×2 IMPLANT
CLOTH BEACON ORANGE TIMEOUT ST (SAFETY) ×2 IMPLANT
COVER SURGICAL LIGHT HANDLE (MISCELLANEOUS) ×2 IMPLANT
DECANTER SPIKE VIAL GLASS SM (MISCELLANEOUS) ×2 IMPLANT
DERMABOND ADVANCED (GAUZE/BANDAGES/DRESSINGS) ×1
DERMABOND ADVANCED .7 DNX12 (GAUZE/BANDAGES/DRESSINGS) ×1 IMPLANT
DRAPE PED LAPAROTOMY (DRAPES) ×2 IMPLANT
DRAPE UTILITY 15X26 W/TAPE STR (DRAPE) ×4 IMPLANT
ELECT CAUTERY BLADE 6.4 (BLADE) ×2 IMPLANT
ELECT REM PT RETURN 9FT ADLT (ELECTROSURGICAL) ×2
ELECTRODE REM PT RTRN 9FT ADLT (ELECTROSURGICAL) ×1 IMPLANT
GAUZE SPONGE 4X4 16PLY XRAY LF (GAUZE/BANDAGES/DRESSINGS) ×2 IMPLANT
GLOVE BIO SURGEON STRL SZ8 (GLOVE) ×2 IMPLANT
GLOVE BIOGEL PI IND STRL 7.0 (GLOVE) IMPLANT
GLOVE BIOGEL PI IND STRL 8 (GLOVE) ×1 IMPLANT
GLOVE BIOGEL PI INDICATOR 7.0 (GLOVE) ×1
GLOVE BIOGEL PI INDICATOR 8 (GLOVE) ×1
GLOVE ECLIPSE 6.5 STRL STRAW (GLOVE) ×2 IMPLANT
GOWN STRL NON-REIN LRG LVL3 (GOWN DISPOSABLE) ×4 IMPLANT
KIT BASIN OR (CUSTOM PROCEDURE TRAY) ×2 IMPLANT
KIT ROOM TURNOVER OR (KITS) ×2 IMPLANT
NDL HYPO 25GX1X1/2 BEV (NEEDLE) ×1 IMPLANT
NEEDLE HYPO 25GX1X1/2 BEV (NEEDLE) ×2 IMPLANT
NS IRRIG 1000ML POUR BTL (IV SOLUTION) ×2 IMPLANT
PACK SURGICAL SETUP 50X90 (CUSTOM PROCEDURE TRAY) ×2 IMPLANT
PAD ARMBOARD 7.5X6 YLW CONV (MISCELLANEOUS) ×2 IMPLANT
PENCIL BUTTON HOLSTER BLD 10FT (ELECTRODE) ×2 IMPLANT
SUT MNCRL AB 4-0 PS2 18 (SUTURE) ×2 IMPLANT
SUT VIC AB 3-0 SH 27 (SUTURE) ×2
SUT VIC AB 3-0 SH 27X BRD (SUTURE) ×1 IMPLANT
SYR CONTROL 10ML LL (SYRINGE) ×2 IMPLANT
TOWEL OR 17X24 6PK STRL BLUE (TOWEL DISPOSABLE) ×2 IMPLANT
TOWEL OR 17X26 10 PK STRL BLUE (TOWEL DISPOSABLE) ×2 IMPLANT
TUBE CONNECTING 12X1/4 (SUCTIONS) IMPLANT
WATER STERILE IRR 1000ML POUR (IV SOLUTION) IMPLANT
YANKAUER SUCT BULB TIP NO VENT (SUCTIONS) IMPLANT

## 2011-02-04 NOTE — Anesthesia Postprocedure Evaluation (Signed)
Anesthesia Post Note  Patient: Lauren Cohen  Procedure(s) Performed:  REMOVAL PORT-A-CATH  Anesthesia type: MAC  Patient location: PACU  Post pain: Pain level controlled and Adequate analgesia  Post assessment: Post-op Vital signs reviewed, Patient's Cardiovascular Status Stable, Respiratory Function Stable, Patent Airway and Pain level controlled  Last Vitals:  Filed Vitals:   02/04/11 0932  BP: 100/65  Pulse: 71  Temp: 36.7 C  Resp: 18    Post vital signs: Reviewed and stable  Level of consciousness: awake, alert  and oriented  Complications: No apparent anesthesia complications

## 2011-02-04 NOTE — Transfer of Care (Signed)
Immediate Anesthesia Transfer of Care Note  Patient: Lauren Cohen  Procedure(s) Performed:  REMOVAL PORT-A-CATH  Patient Location: PACU  Anesthesia Type: MAC  Level of Consciousness: awake, alert  and oriented  Airway & Oxygen Therapy: Patient connected to face mask oxygen  Post-op Assessment: Report given to PACU RN, Post -op Vital signs reviewed and stable and Patient moving all extremities X 4  Post vital signs: Reviewed and stable  Complications: No apparent anesthesia complications

## 2011-02-04 NOTE — Anesthesia Preprocedure Evaluation (Addendum)
Anesthesia Evaluation  Patient identified by MRN, date of birth, ID band Patient awake    Reviewed: Allergy & Precautions, H&P , NPO status , Patient's Chart, lab work & pertinent test results  Airway Mallampati: II  Neck ROM: full    Dental No notable dental hx.    Pulmonary neg pulmonary ROS,    Pulmonary exam normal       Cardiovascular neg cardio ROS     Neuro/Psych Negative Neurological ROS  Negative Psych ROS   GI/Hepatic PUD, (+) Hepatitis -, B  Endo/Other  Negative Endocrine ROS  Renal/GU negative Renal ROS  Genitourinary negative   Musculoskeletal   Abdominal   Peds  Hematology negative hematology ROS (+)   Anesthesia Other Findings   Reproductive/Obstetrics negative OB ROS                           Anesthesia Physical Anesthesia Plan  ASA: II  Anesthesia Plan: MAC   Post-op Pain Management:    Induction: Intravenous  Airway Management Planned: Simple Face Mask  Additional Equipment:   Intra-op Plan:   Post-operative Plan:   Informed Consent: I have reviewed the patients History and Physical, chart, labs and discussed the procedure including the risks, benefits and alternatives for the proposed anesthesia with the patient or authorized representative who has indicated his/her understanding and acceptance.     Plan Discussed with: CRNA and Surgeon  Anesthesia Plan Comments:         Anesthesia Quick Evaluation

## 2011-02-04 NOTE — Interval H&P Note (Signed)
History and Physical Interval Note:   02/04/2011   11:06 AM   Lauren Cohen  has presented today for surgery, with the diagnosis of unneeded port-a-cath  The various methods of treatment have been discussed with the patient and family. After consideration of risks, benefits and other options for treatment, the patient has consented to  Procedure(s): REMOVAL PORT-A-CATH as a surgical intervention .  The patients' history has been reviewed, patient examined, no change in status, stable for surgery.  I have reviewed the patients' chart and labs.  Questions were answered to the patient's satisfaction.     Emry Tobin A.  MD    Pt seen and reexamined.  No change to history and physical.

## 2011-02-04 NOTE — Op Note (Signed)
Preop diagnosis: Indwelling port a catheter for chemotherapy right subclavian   Postop diagnosis: Same  Procedure: Removal of port a catheter right   Surgeon: Harriette Bouillon M.D.  Anesthesia: MAC with local  EBL: Minimal  Specimen none  Drains: None  Indications for procedure: The patient presents for removal of port a catheter after completing chemotherapy. The patient no longer requires central venous access. Risks of bleeding, infection, catheter fragmentation, embolization, arrhythmias and damage to arteries, veins and nerves and possibly other mediastinal structures discussed. The patient agrees to proceed.  Description of procedure: The patient was seen in the holding area. Questions were answered. The patient agreed to proceed. The patient was taken to the operating room. The patient was placed supine. Anesthesia was initiated. The skin on the upper chest was prepped and draped in a sterile fashion. Timeout was done. The patient received preoperative antibiotics. Incision was made through the old port site on the right side and the hub of the Port-A-Cath was seen. The sutures were cut to release the port from the chest wall. The catheter was removed in its entirety without difficulty. The tract was closed with 3-0 Vicryl. 4 Monocryl was used to close the skin. All final counts were correct. The patient was taken to recovery in satisfactory condition.

## 2011-02-04 NOTE — Interval H&P Note (Signed)
History and Physical Interval Note:   02/04/2011   5:34 AM   Lauren Cohen  has presented today for surgery, with the diagnosis of unneeded port-a-cath  The various methods of treatment have been discussed with the patient and family. After consideration of risks, benefits and other options for treatment, the patient has consented to  Procedure(s): REMOVAL PORT-A-CATH as a surgical intervention .  The patients' history has been reviewed, patient examined, no change in status, stable for surgery.  I have reviewed the patients' chart and labs.  Questions were answered to the patient's satisfaction.     Lauren Cohen A.  MD       No change to history and physical.

## 2011-02-04 NOTE — Anesthesia Procedure Notes (Addendum)
Performed by: Elon Alas   Procedure Name: MAC Date/Time: 02/04/2011 11:57 AM Performed by: Elon Alas Pre-anesthesia Checklist: Patient identified, Emergency Drugs available, Suction available, Patient being monitored and Timeout performed Patient Re-evaluated:Patient Re-evaluated prior to inductionOxygen Delivery Method: Simple face mask Intubation Type: IV induction Placement Confirmation: positive ETCO2

## 2011-02-04 NOTE — Preoperative (Signed)
Beta Blockers   Reason not to administer Beta Blockers:Not Applicable 

## 2011-02-08 ENCOUNTER — Encounter (HOSPITAL_COMMUNITY): Payer: Self-pay | Admitting: Surgery

## 2011-02-08 MED FILL — Mupirocin Oint 2%: CUTANEOUS | Qty: 22 | Status: AC

## 2011-03-05 ENCOUNTER — Ambulatory Visit (INDEPENDENT_AMBULATORY_CARE_PROVIDER_SITE_OTHER): Payer: Self-pay | Admitting: Gastroenterology

## 2011-03-05 ENCOUNTER — Encounter: Payer: Self-pay | Admitting: Gastroenterology

## 2011-03-05 VITALS — BP 98/62 | HR 78 | Ht 64.0 in | Wt 136.4 lb

## 2011-03-05 DIAGNOSIS — K279 Peptic ulcer, site unspecified, unspecified as acute or chronic, without hemorrhage or perforation: Secondary | ICD-10-CM

## 2011-03-05 DIAGNOSIS — R195 Other fecal abnormalities: Secondary | ICD-10-CM

## 2011-03-05 DIAGNOSIS — K921 Melena: Secondary | ICD-10-CM

## 2011-03-05 NOTE — Progress Notes (Signed)
Review of pertinent gastrointestinal problems: 1. duodenal ulcer noted by EGD December 2011 by Dr. Christella Hartigan. This was a clean base. Biopsies showed H. pylori positive gastritis. She presented with bleeding around   HPI: This is a very pleasant 40 year old woman who is here with a [place] interpreter.  She has noticed some black stools recently, still with dyspepsia.  No NSAIDs.  Feeling bloated.  "doesn't digest well"  "very often."  She does recall being on antibiotics for the h. Pylori one year ago.  CBC last month showed only mild anemia.  She does not get much heartburn and has not been on any antiacid medicines  Past Medical History  Diagnosis Date  . Hepatitis B infection   . Breast cancer 2011  . 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 Duodenal ulcer   . S/P left mastectomy 2011  . Peptic ulcer disease   . Gastritis   . Anemia   . GI bleed     Past Surgical History  Procedure Date  . Left modified mastectomy 2011  . Portacath placement   . Mastectomy   . Port-a-cath removal 02/04/2011    Procedure: REMOVAL PORT-A-CATH;  Surgeon: Clovis Pu. Cornett, MD;  Location: [facility] OR;  Service: General;  Laterality: N/A;    Current Outpatient Prescriptions  Medication Sig Dispense Refill  . tenofovir (VIREAD) 300 MG tablet Take 300 mg by mouth daily.        Marland Kitchen docusate sodium (COLACE) 100 MG capsule Take 100 mg by mouth daily.        . NON FORMULARY Chemo regimen  Every TUE       . oxyCODONE-acetaminophen (PERCOCET) 10-325 MG per tablet Take 1 tablet by mouth every 6 (six) hours as needed for pain.  20 tablet  0  . pantoprazole (PROTONIX) 40 MG tablet Take 40 mg by mouth daily.        . prochlorperazine (COMPAZINE) 10 MG tablet Take 10 mg by mouth every 6 (six) hours as needed.        . sucralfate (CARAFATE) 1 G tablet Take 1 g by mouth 3 (three) times daily.          Allergies as of 03/05/2011  . (No Known Allergies)    Family History  Problem Relation Age of Onset  . Hepatitis Mother      History   Social History  . Marital Status: Married    Spouse Name: N/A    Number of Children: 2  . Years of Education: N/A   Occupational History  . Not on file.   Social History Main Topics  . Smoking status: Never Smoker   . Smokeless tobacco: Never Used  . Alcohol Use: No  . Drug Use: No  . Sexually Active: Yes    Birth Control/ Protection: Inserts   Other Topics Concern  . Not on file   Social History Narrative  . No narrative on file      Physical Exam: BP 98/62  Pulse 78  Ht 5\' 4"  (1.626 m)  Wt 136 lb 6.4 oz (61.871 kg)  BMI 23.41 kg/m2  SpO2 98%  LMP 02/26/2011 Constitutional: generally well-appearing Psychiatric: alert and oriented x3 Abdomen: soft, nontender, nondistended, no obvious ascites, no peritoneal signs, normal bowel sounds     Assessment and plan: 40 y.o. female with history of duodenal ulcer, recurrent dyspeptic symptoms, black stools, mild anemia  She may have recurrent H. pyloric, duodenal ulcer. We'll proceed with EGD and her Synvisc convenience and in  the meantime she will restart proton pump inhibitor once daily.

## 2011-03-05 NOTE — Patient Instructions (Signed)
You will be set up for an upper endoscopy given dyspepsia, h/o duodenal ulcer, current black stools. In meantime, samples of PPI given.  Please start one pill once daily.

## 2011-03-11 ENCOUNTER — Ambulatory Visit (AMBULATORY_SURGERY_CENTER): Payer: Self-pay | Admitting: Gastroenterology

## 2011-03-11 ENCOUNTER — Encounter: Payer: Self-pay | Admitting: Gastroenterology

## 2011-03-11 DIAGNOSIS — K279 Peptic ulcer, site unspecified, unspecified as acute or chronic, without hemorrhage or perforation: Secondary | ICD-10-CM

## 2011-03-11 DIAGNOSIS — K2970 Gastritis, unspecified, without bleeding: Secondary | ICD-10-CM

## 2011-03-11 DIAGNOSIS — K2940 Chronic atrophic gastritis without bleeding: Secondary | ICD-10-CM

## 2011-03-11 DIAGNOSIS — R195 Other fecal abnormalities: Secondary | ICD-10-CM

## 2011-03-11 MED ORDER — SODIUM CHLORIDE 0.9 % IV SOLN: 500.0000 mL | INTRAVENOUS | Status: DC

## 2011-03-11 NOTE — Op Note (Signed)
[facility] [address] [place], [place]  [address]  ENDOSCOPY PROCEDURE REPORT  PATIENT:  Lauren, Cohen  MR#:  #[id number] BIRTHDATE:  Apr 29, 1970, 40 yrs. old  GENDER:  female ENDOSCOPIST:  Rachael Fee, MD PROCEDURE DATE:  03/11/2011 PROCEDURE:  EGD with biopsy, 74[address] ASA CLASS:  Class II INDICATIONS:  duodenal ulcer noted by EGD December 2011 by Dr. Christella Hartigan. This was a clean base. Biopsies showed H. pylori positive gastritis. She presented with bleeding around MEDICATIONS:   Fentanyl 50 mcg IV, These medications were titrated to patient response per physician's verbal order, Versed 6 mg IV TOPICAL ANESTHETIC:  Cetacaine Spray  DESCRIPTION OF PROCEDURE:   After the risks benefits and alternatives of the procedure were thoroughly explained, informed consent was obtained.  The LB GIF-H180 [id number] endoscope was introduced through the mouth and advanced to the second portion of the duodenum, without limitations.  The instrument was slowly withdrawn as the mucosa was fully examined. <<PROCEDUREIMAGES>> Mild gastritis was found. There was mild, non-specific gastritis. Biopsies taken and sent to pathology (jar 1) (see image3). Otherwise the examination was normal (see image4, image2, and image1).    Retroflexed views revealed no abnormalities.    The scope was then withdrawn from the patient and the procedure completed.  COMPLICATIONS:  None  ENDOSCOPIC IMPRESSION: 1) Mild gastritis, biopsied to check for H. pylori 2) Otherwise normal examination  RECOMMENDATIONS: Await final biopsy.  If H. pylori noted, then appropriate antibiotics will be started.  ______________________________ Rachael Fee, MD  n. eSIGNED:   Rachael Fee at 03/11/2011 03:55 PM  Noralee Stain, #[id number]

## 2011-03-11 NOTE — Progress Notes (Signed)
1557-Upon arrival to recovery room pt has a red, raised rash on left neck and chin area. Procedure room nurse reports that this was not present on arrival to procedure room. Pt states that it itches. Rash is warm to touch and slightly raised. MD in to evaluate. MD verbal order 12.5mg  IV Benadryl stat. Benadryl administered (1607). Will continue to assess. Pt vitals stable. Pt awake and talking. Pt verbalizes understanding, care partner at bedside, care partner verbalizes understanding.  1644-Pt vitals stable throughout recovery phase. Raised, red area on left neck and chin is smaller. Pt no longer complains of itching. Site is not warm to touch. Pt states that she "feel good." Pt ready for discharge.  Patient did not experience any of the following events: a burn prior to discharge; a fall within the facility; wrong site/side/patient/procedure/implant event; or a hospital transfer or hospital admission upon discharge from the facility. [phone]) Patient did not have preoperative order for IV antibiotic SSI prophylaxis. [phone])

## 2011-03-11 NOTE — Patient Instructions (Signed)
Please refer to your blue and neon green sheets for instructions regarding diet and activity for the rest of today.  You may resume your medications as you would normally take them.  

## 2011-03-12 ENCOUNTER — Telehealth: Payer: Self-pay | Admitting: *Deleted

## 2011-03-12 NOTE — Telephone Encounter (Signed)

## 2011-03-20 ENCOUNTER — Encounter: Payer: Self-pay | Admitting: *Deleted

## 2011-03-30 ENCOUNTER — Telehealth: Payer: Self-pay | Admitting: Oncology

## 2011-03-30 NOTE — Telephone Encounter (Signed)
Lvm advising appt 04/03/11 @ 2pm.

## 2011-04-03 ENCOUNTER — Ambulatory Visit (HOSPITAL_BASED_OUTPATIENT_CLINIC_OR_DEPARTMENT_OTHER): Payer: Self-pay | Admitting: Oncology

## 2011-04-03 ENCOUNTER — Other Ambulatory Visit: Payer: Self-pay

## 2011-04-03 ENCOUNTER — Other Ambulatory Visit (HOSPITAL_BASED_OUTPATIENT_CLINIC_OR_DEPARTMENT_OTHER): Payer: Self-pay

## 2011-04-03 VITALS — BP 117/61 | HR 69 | Temp 98.2°F | Ht 64.0 in | Wt 134.3 lb

## 2011-04-03 DIAGNOSIS — C50919 Malignant neoplasm of unspecified site of unspecified female breast: Secondary | ICD-10-CM

## 2011-04-03 DIAGNOSIS — C50419 Malignant neoplasm of upper-outer quadrant of unspecified female breast: Secondary | ICD-10-CM

## 2011-04-03 LAB — CBC WITH DIFFERENTIAL/PLATELET
BASO%: 0.4 % (ref 0.0–2.0)
HCT: 35 % (ref 34.8–46.6)
MCHC: 32.9 g/dL (ref 31.5–36.0)
MONO#: 0.3 10*3/uL (ref 0.1–0.9)
NEUT%: 65.8 % (ref 38.4–76.8)
WBC: 4.8 10*3/uL (ref 3.9–10.3)
lymph#: 1.3 10*3/uL (ref 0.9–3.3)

## 2011-04-03 LAB — COMPREHENSIVE METABOLIC PANEL
AST: 34 U/L (ref 0–37)
Alkaline Phosphatase: 83 U/L (ref 39–117)
BUN: 10 mg/dL (ref 6–23)
Glucose, Bld: 87 mg/dL (ref 70–99)
Potassium: 3.9 mEq/L (ref 3.5–5.3)
Total Bilirubin: 0.6 mg/dL (ref 0.3–1.2)

## 2011-04-03 LAB — LACTATE DEHYDROGENASE: LDH: 133 U/L (ref 94–250)

## 2011-04-03 NOTE — Progress Notes (Signed)
Hematology and Oncology Follow Up Visit  Lauren Cohen [id number] 1970/12/25 41 y.o. 04/03/2011 2:56 PM PCP  Principle Diagnosis:  A 41 year old [place], [place], woman of [place] descent with:  1. 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  She completed 6 cycles of q.3 week Taxotere, carboplatin and Herceptin with weekly Herceptin.  She then completed radiation therapy on 08/17/2010 with weekly Herceptin given through the duration.  She  completed her maintenance q.3 week doses of Herceptin on 10/12.  2. History of duodenal ulcer, prior Carafate use, on Protonix 40 mg per day.   3. History of hepatitis B on Viread followed by Dr. Alessandra Grout.   Interim History:  There have been no intercurrent illness, hospitalizations or medication changes.  Medications: I have reviewed the patient's current medications.  Allergies: No Known Allergies  Past Medical History, Surgical history, Social history, and Family History were reviewed and updated.  Review of Systems: Constitutional:  Negative for fever, chills, night sweats, anorexia, weight loss, pain. Cardiovascular: no chest pain or dyspnea on exertion Respiratory: no cough, shortness of breath, or wheezing Neurological: negative Dermatological: negative, noted lt breast/implant discomfort on palpation ENT: negative Skin Gastrointestinal: no abdominal pain, change in bowel habits, or black or bloody stools Genito-Urinary: no dysuria, trouble voiding, or hematuria Hematological and Lymphatic: negative Breast: negative Musculoskeletal: positive for - pain in finger - bilateral Remaining ROS negative.  Physical Exam: Blood pressure 117/61, pulse 69, temperature 98.2 F (36.8 C),  temperature source Oral, height 5\' 4"  (1.626 m), weight 134 lb 4.8 oz (60.918 kg), last menstrual period 02/26/2011. ECOG: 0 General appearance: alert, cooperative and appears stated age Head: Normocephalic, without obvious abnormality, atraumatic Neck: no adenopathy, no carotid bruit, no JVD, supple, symmetrical, trachea midline and thyroid not enlarged, symmetric, no tenderness/mass/nodules Lymph nodes: Cervical, supraclavicular, and axillary nodes normal. Cardiac : regular rate and rhythm, no murmurs or gallops Pulmonary:clear to auscultation bilaterally and normal percussion bilaterally Breasts: inspection negative, no nipple discharge or bleeding, no masses or nodularity palpable lt brst s/p mrm with implant, no masses Abdomen:soft, non-tender; bowel sounds normal; no masses,  no organomegaly Extremities negative Neuro: alert, oriented, normal speech, no focal findings or movement disorder noted,  Lab Results: Lab Results  Component Value Date   WBC 4.8 04/03/2011   HGB 11.5* 04/03/2011   HCT 35.0 04/03/2011   MCV 82.5 04/03/2011   PLT 211 04/03/2011     Chemistry      Component Value Date/Time   NA 139 02/04/2011 0948   K 4.1 02/04/2011 0948   CL 105 02/04/2011 0948   CO2 26 02/04/2011 0948   BUN 11 02/04/2011 0948   CREATININE 0.71 02/04/2011 0948      Component Value Date/Time   CALCIUM 9.7 02/04/2011 0948   ALKPHOS 105 02/04/2011 0948   AST 28 02/04/2011 0948   ALT 23 02/04/2011 0948   BILITOT 0.4 02/04/2011 0948      .pathology. Radiological Studies: chest X-ray n/a Mammogram 9/12-wnl Bone density n/a  Impression and Plan: Pt is doing well, c/o discomfort over lt implant..will send for u/s. F/u 3 months.  More than 50% of the visit was spent in patient-related counselling   Pierce Crane, MD 1/2/20132:56 PM

## 2011-04-09 ENCOUNTER — Telehealth: Payer: Self-pay | Admitting: Oncology

## 2011-04-09 NOTE — Telephone Encounter (Signed)
Gv pt appt for april2013.  sent Mammogram order back to MD to correct to be scheduled

## 2011-04-15 ENCOUNTER — Telehealth: Payer: Self-pay | Admitting: Oncology

## 2011-04-15 NOTE — Telephone Encounter (Signed)
called the breast center s/w kasie and the orders are still not correct.  kasie stated that they will email MD to informed him of correction

## 2011-04-18 ENCOUNTER — Other Ambulatory Visit: Payer: Self-pay | Admitting: Oncology

## 2011-04-18 ENCOUNTER — Telehealth: Payer: Self-pay | Admitting: *Deleted

## 2011-04-18 DIAGNOSIS — C50919 Malignant neoplasm of unspecified site of unspecified female breast: Secondary | ICD-10-CM

## 2011-04-18 DIAGNOSIS — N644 Mastodynia: Secondary | ICD-10-CM

## 2011-04-18 NOTE — Telephone Encounter (Signed)
na

## 2011-04-26 ENCOUNTER — Other Ambulatory Visit: Payer: Self-pay

## 2011-05-06 IMAGING — CT CT ABD-PELV W/ CM
2 of 4 series · 17 of 46 positions shown, 19 images · IV contrast (agent unspecified)
Comparison: None.

CT CHEST

CLINICAL DATA: Newly diagnosed breast carcinoma.  Staging.

CT CHEST, ABDOMEN AND PELVIS WITH CONTRAST
TECHNIQUE: Multidetector CT imaging of the chest, abdomen and
pelvis was performed following the standard protocol during bolus
administration of intravenous contrast.
Contrast: 100 ml Nmnipaque-R55 and oral contrast

[Series 2: cap with st · axial · 0.73mm/px · z∈[-788,-224]mm · 14 of 125 slices shown, 16 images]
[im 6/125  soft-tissue]
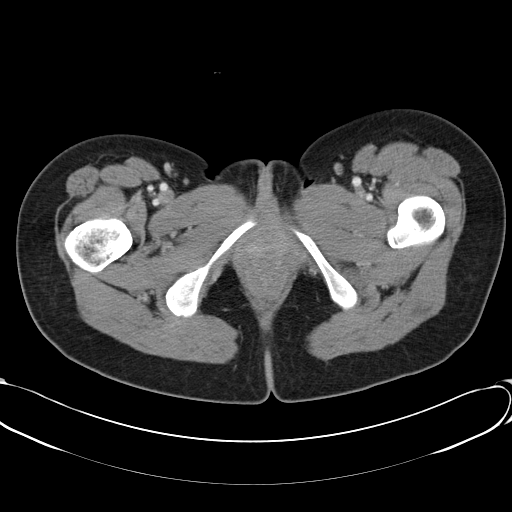
[im 6/125  bone]
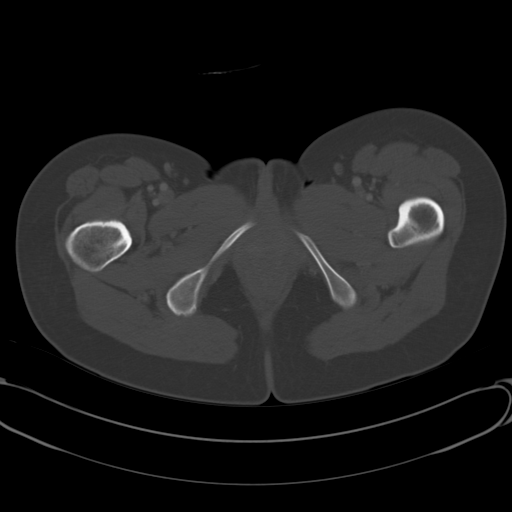
[im 17/125  soft-tissue]
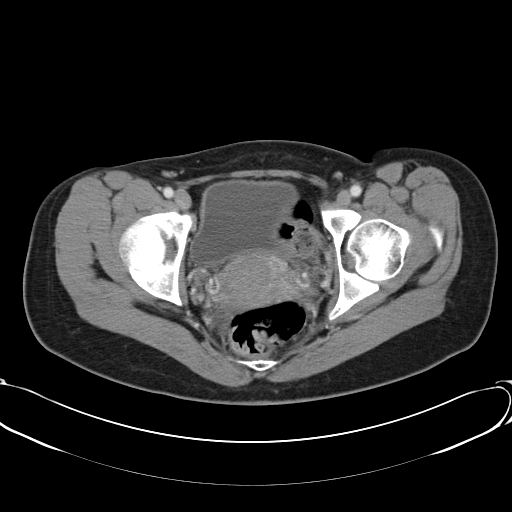
[im 23/125  soft-tissue]
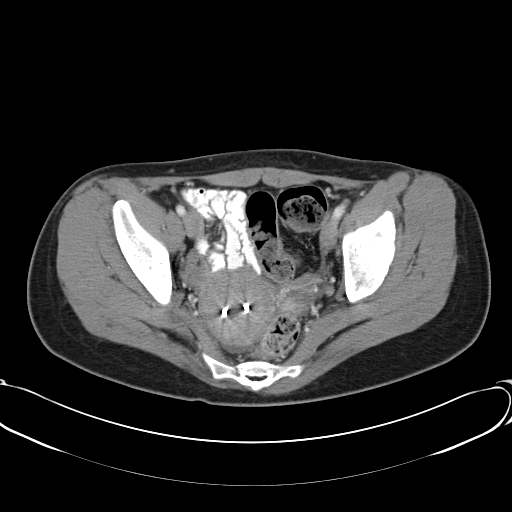
[im 34/125  soft-tissue]
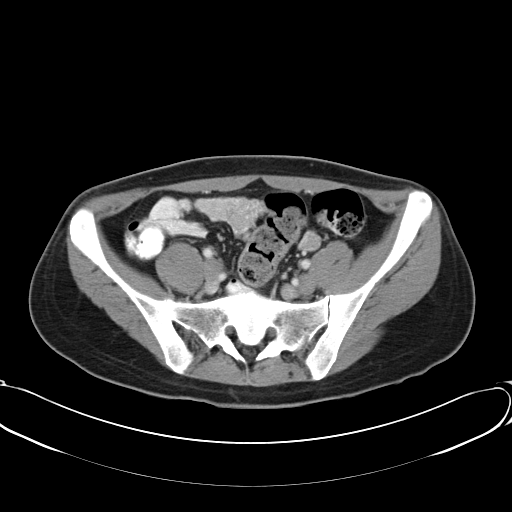
[im 40/125  soft-tissue]
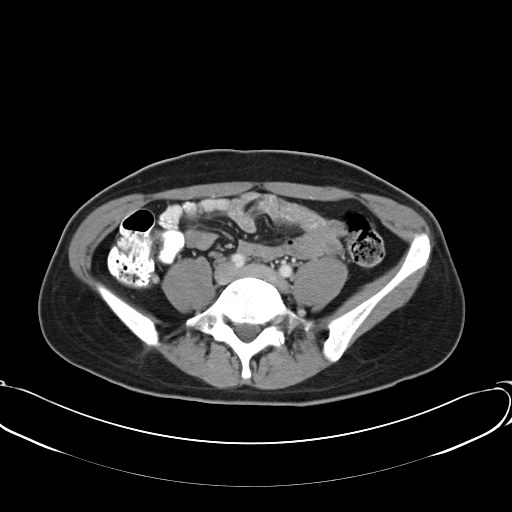
[im 51/125  soft-tissue]
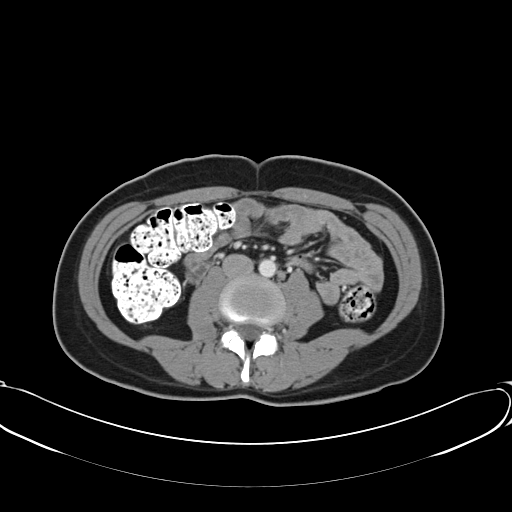
[im 57/125  soft-tissue]
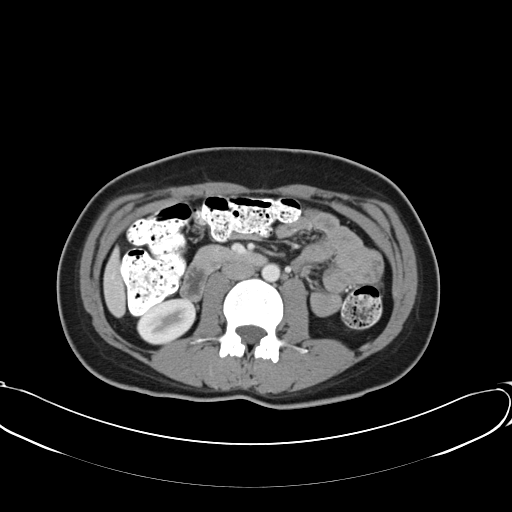
[im 68/125  soft-tissue]
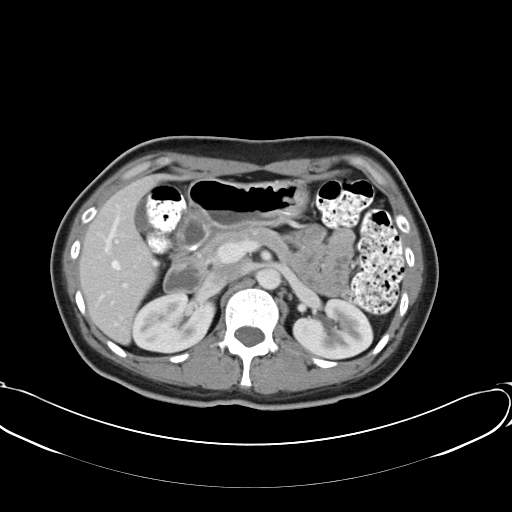
[im 74/125  soft-tissue]
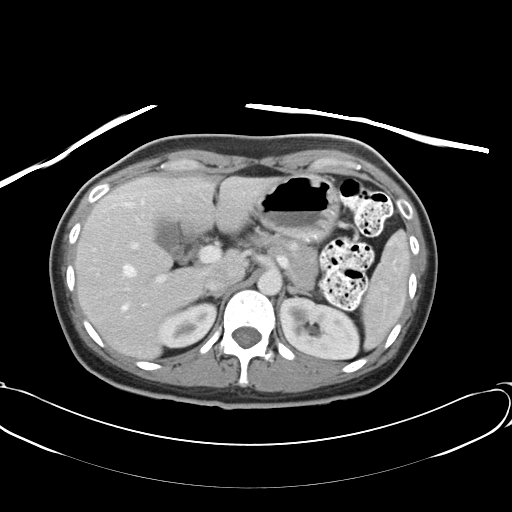
[im 74/125  bone]
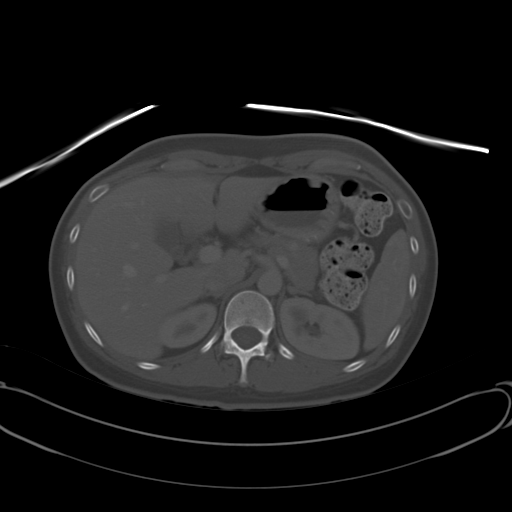
[im 85/125  soft-tissue]
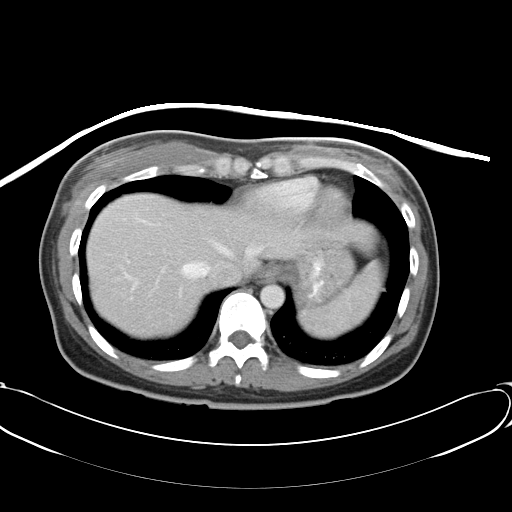
[im 91/125  soft-tissue]
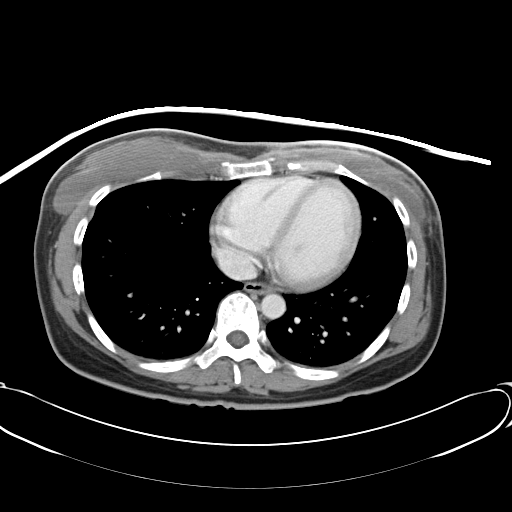
[im 102/125  soft-tissue]
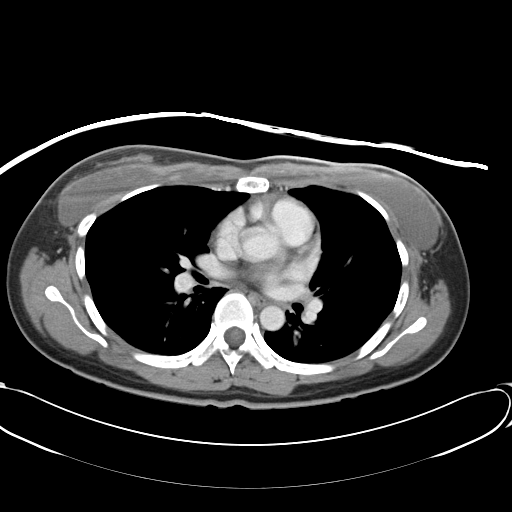
[im 108/125  soft-tissue]
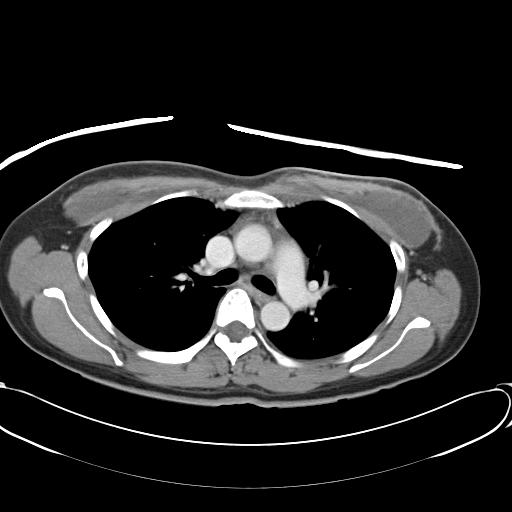
[im 119/125  soft-tissue]
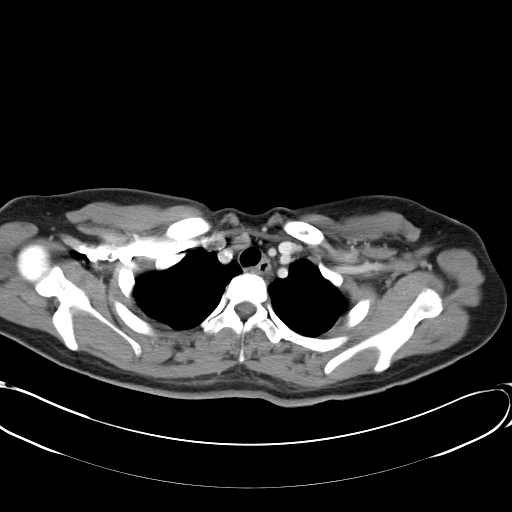

[Series 602: <mpr thick range> · coronal · 1.22mm/px · 3 of 71 slices shown]
[im 24/71  soft-tissue]
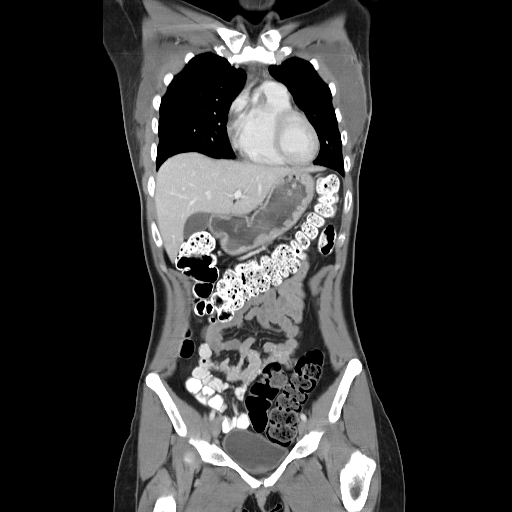
[im 32/71  soft-tissue]
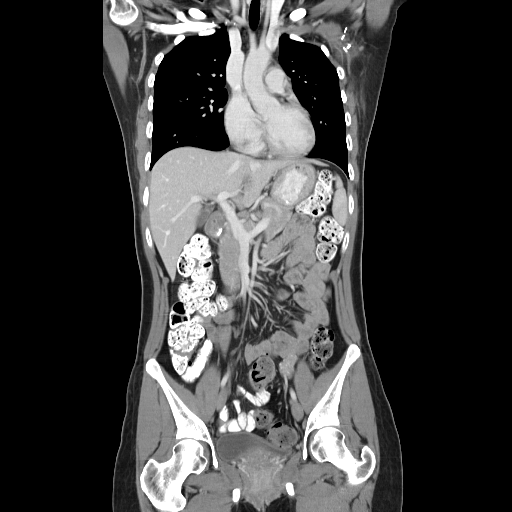
[im 39/71  soft-tissue]
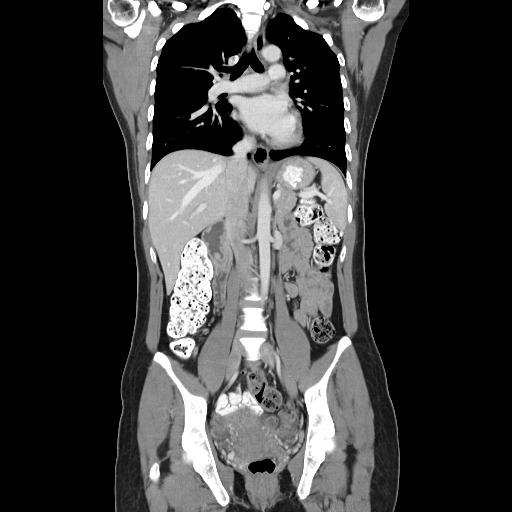

[17 of 46 positions shown; findings below may reference images not displayed]

FINDINGS: No evidence of mediastinal or hilar masses.  No
adenopathy seen in the axillary regions or elsewhere within the
thorax.

No evidence of pleural or pericardial effusion.  No suspicious
pulmonary nodules or masses are identified.  No evidence of
pulmonary infiltrate.  No suspicious bone lesions identified.
IMPRESSION: Negative.  No evidence of metastatic disease or other significant
abnormality.

CT ABDOMEN AND PELVIS
FINDINGS: The abdominal parenchymal organs are normal in
appearance.  Gallbladder is unremarkable.  No evidence of
hydronephrosis.

No soft tissue masses or lymphadenopathy identified within the
abdomen or pelvis.  IUD noted in the uterus.  No evidence of
inflammatory process or abnormal fluid collections.  No evidence of
bowel wall thickening or dilatation.  No suspicious bone lesions
are identified.
IMPRESSION: Negative.  No evidence of metastatic disease or other acute
findings.

## 2011-05-07 ENCOUNTER — Ambulatory Visit
Admission: RE | Admit: 2011-05-07 | Discharge: 2011-05-07 | Disposition: A | Discharge status: Home / Self Care | Payer: Self-pay | Source: Ambulatory Visit | Attending: Oncology | Admitting: Oncology

## 2011-05-07 DIAGNOSIS — N644 Mastodynia: Secondary | ICD-10-CM

## 2011-07-09 ENCOUNTER — Ambulatory Visit (HOSPITAL_BASED_OUTPATIENT_CLINIC_OR_DEPARTMENT_OTHER): Payer: Self-pay | Admitting: Oncology

## 2011-07-09 ENCOUNTER — Other Ambulatory Visit (HOSPITAL_BASED_OUTPATIENT_CLINIC_OR_DEPARTMENT_OTHER): Payer: Self-pay | Admitting: Lab

## 2011-07-09 ENCOUNTER — Telehealth: Payer: Self-pay | Admitting: *Deleted

## 2011-07-09 VITALS — BP 113/69 | HR 76 | Temp 97.9°F | Ht 64.0 in | Wt 130.5 lb

## 2011-07-09 DIAGNOSIS — C50419 Malignant neoplasm of upper-outer quadrant of unspecified female breast: Secondary | ICD-10-CM

## 2011-07-09 DIAGNOSIS — C50919 Malignant neoplasm of unspecified site of unspecified female breast: Secondary | ICD-10-CM

## 2011-07-09 DIAGNOSIS — Z171 Estrogen receptor negative status [ER-]: Secondary | ICD-10-CM

## 2011-07-09 LAB — COMPREHENSIVE METABOLIC PANEL
ALT: 20 U/L (ref 0–35)
CO2: 25 mEq/L (ref 19–32)
Calcium: 8.9 mg/dL (ref 8.4–10.5)
Chloride: 106 mEq/L (ref 96–112)
Sodium: 137 mEq/L (ref 135–145)
Total Protein: 6.4 g/dL (ref 6.0–8.3)

## 2011-07-09 LAB — CBC WITH DIFFERENTIAL/PLATELET
Eosinophils Absolute: 0 10*3/uL (ref 0.0–0.5)
MONO#: 0.3 10*3/uL (ref 0.1–0.9)
NEUT#: 2.3 10*3/uL (ref 1.5–6.5)
Platelets: 196 10*3/uL (ref 145–400)
RBC: 3.97 10*6/uL (ref 3.70–5.45)
RDW: 15.3 % — ABNORMAL HIGH (ref 11.2–14.5)
WBC: 4 10*3/uL (ref 3.9–10.3)
nRBC: 0 % (ref 0–0)

## 2011-07-09 LAB — CANCER ANTIGEN 27.29: CA 27.29: 13 U/mL (ref 0–39)

## 2011-07-09 LAB — LACTATE DEHYDROGENASE: LDH: 134 U/L (ref 94–250)

## 2011-07-09 NOTE — Telephone Encounter (Signed)
gave patient appointment for 01-2012 printed out calendar and gave to the patient 

## 2011-07-09 NOTE — Progress Notes (Signed)
Hematology and Oncology Follow Up Visit  Lauren Cohen [id number] 1970-07-09 41 y.o. 07/09/2011 2:27 PM PCP  Principle Diagnosis:  A 41 year old [place], woman of Lauren Cohen descent with:  1. 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  She completed 6 cycles of q.3 week Taxotere, carboplatin and Herceptin with weekly Herceptin.  She then completed radiation therapy on 08/17/2010 with weekly Herceptin given through the duration.  She  completed her maintenance q.3 week doses of Herceptin on 10/12.  2. History of duodenal ulcer, prior Carafate use, on Protonix 40 mg per day.   3. History of hepatitis B on Viread followed by Dr. Alessandra Grout.   Interim History:  There have been no intercurrent illness, hospitalizations or medication changes.she has occasional vertigo when she gets up from a seated position.  Medications: I have reviewed the patient's current medications.  Allergies: No Known Allergies  Past Medical History, Surgical history, Social history, and Family History were reviewed and updated.  Review of Systems: Constitutional:  Negative for fever, chills, night sweats, anorexia, weight loss, pain. Cardiovascular: no chest pain or dyspnea on exertion Respiratory: no cough, shortness of breath, or wheezing Neurological: negative Dermatological: negative, noted lt breast/implant discomfort on palpation ENT: negative Skin Gastrointestinal: no abdominal pain, change in bowel habits, or black or bloody stools Genito-Urinary: no dysuria, trouble voiding, or hematuria Hematological and Lymphatic: negative Breast: negative Musculoskeletal: positive for - pain in finger - bilateral Remaining ROS negative.  Physical  Exam: Blood pressure 113/69, pulse 76, temperature 97.9 F (36.6 C), height 5\' 4"  (1.626 m), weight 130 lb 8 oz (59.194 kg). ECOG: 0 General appearance: alert, cooperative and appears stated age Head: Normocephalic, without obvious abnormality, atraumatic Neck: no adenopathy, no carotid bruit, no JVD, supple, symmetrical, trachea midline and thyroid not enlarged, symmetric, no tenderness/mass/nodules Lymph nodes: Cervical, supraclavicular, and axillary nodes normal. Cardiac : regular rate and rhythm, no murmurs or gallops Pulmonary:clear to auscultation bilaterally and normal percussion bilaterally Breasts: inspection negative, no nipple discharge or bleeding, no masses or nodularity palpable lt brst s/p mrm with implant, no masses Abdomen:soft, non-tender; bowel sounds normal; no masses,  no organomegaly Extremities negative Neuro: alert, oriented, normal speech, no focal findings or movement disorder noted,  Lab Results: Lab Results  Component Value Date   WBC 4.0 07/09/2011   HGB 10.9* 07/09/2011   HCT 33.3* 07/09/2011   MCV 83.8 07/09/2011   PLT 196 07/09/2011     Chemistry      Component Value Date/Time   NA 139 04/03/2011 1408   NA 139 04/03/2011 1408   NA 139 04/03/2011 1408   K 3.9 04/03/2011 1408   K 3.9 04/03/2011 1408   K 3.9 04/03/2011 1408   CL 105 04/03/2011 1408   CL 105 04/03/2011 1408   CL 105 04/03/2011 1408   CO2 25 04/03/2011 1408   CO2 25 04/03/2011 1408   CO2 25 04/03/2011 1408   BUN 10 04/03/2011 1408   BUN 10 04/03/2011 1408   BUN 10 04/03/2011 1408   CREATININE 0.67 04/03/2011 1408   CREATININE 0.67 04/03/2011 1408   CREATININE 0.67 04/03/2011 1408      Component Value Date/Time   CALCIUM 9.2 04/03/2011 1408   CALCIUM 9.2 04/03/2011 1408   CALCIUM 9.2 04/03/2011 1408  ALKPHOS 83 04/03/2011 1408   ALKPHOS 83 04/03/2011 1408   ALKPHOS 83 04/03/2011 1408   AST 34 04/03/2011 1408   AST 34 04/03/2011 1408   AST 34 04/03/2011 1408   ALT 30 04/03/2011 1408   ALT 30 04/03/2011 1408   ALT 30 04/03/2011 1408    BILITOT 0.6 04/03/2011 1408   BILITOT 0.6 04/03/2011 1408   BILITOT 0.6 04/03/2011 1408      .pathology. Radiological Studies: chest X-ray n/a Mammogram 9/13  Bone density n/a  Impression and Plan: She is doing well, w/out evidence for recurrence. We will monitor her carefully as she is high risk for recurrence.will hold on CNS imaging for now, f/u mammogram in 9/13. F/u 3 months.  More than 50% of the visit was spent in patient-related counselling   Pierce Crane, MD 4/9/20132:27 PM

## 2011-07-30 IMAGING — CR DG ABDOMEN ACUTE W/ 1V CHEST
3 series · 3 of 3 positions shown · non-contrast
Comparison: 10/25/2009

CLINICAL DATA: Hemoptysis.  History breast carcinoma.

ACUTE ABDOMEN SERIES (ABDOMEN 2 VIEW & CHEST 1 VIEW)

[w chest pa]
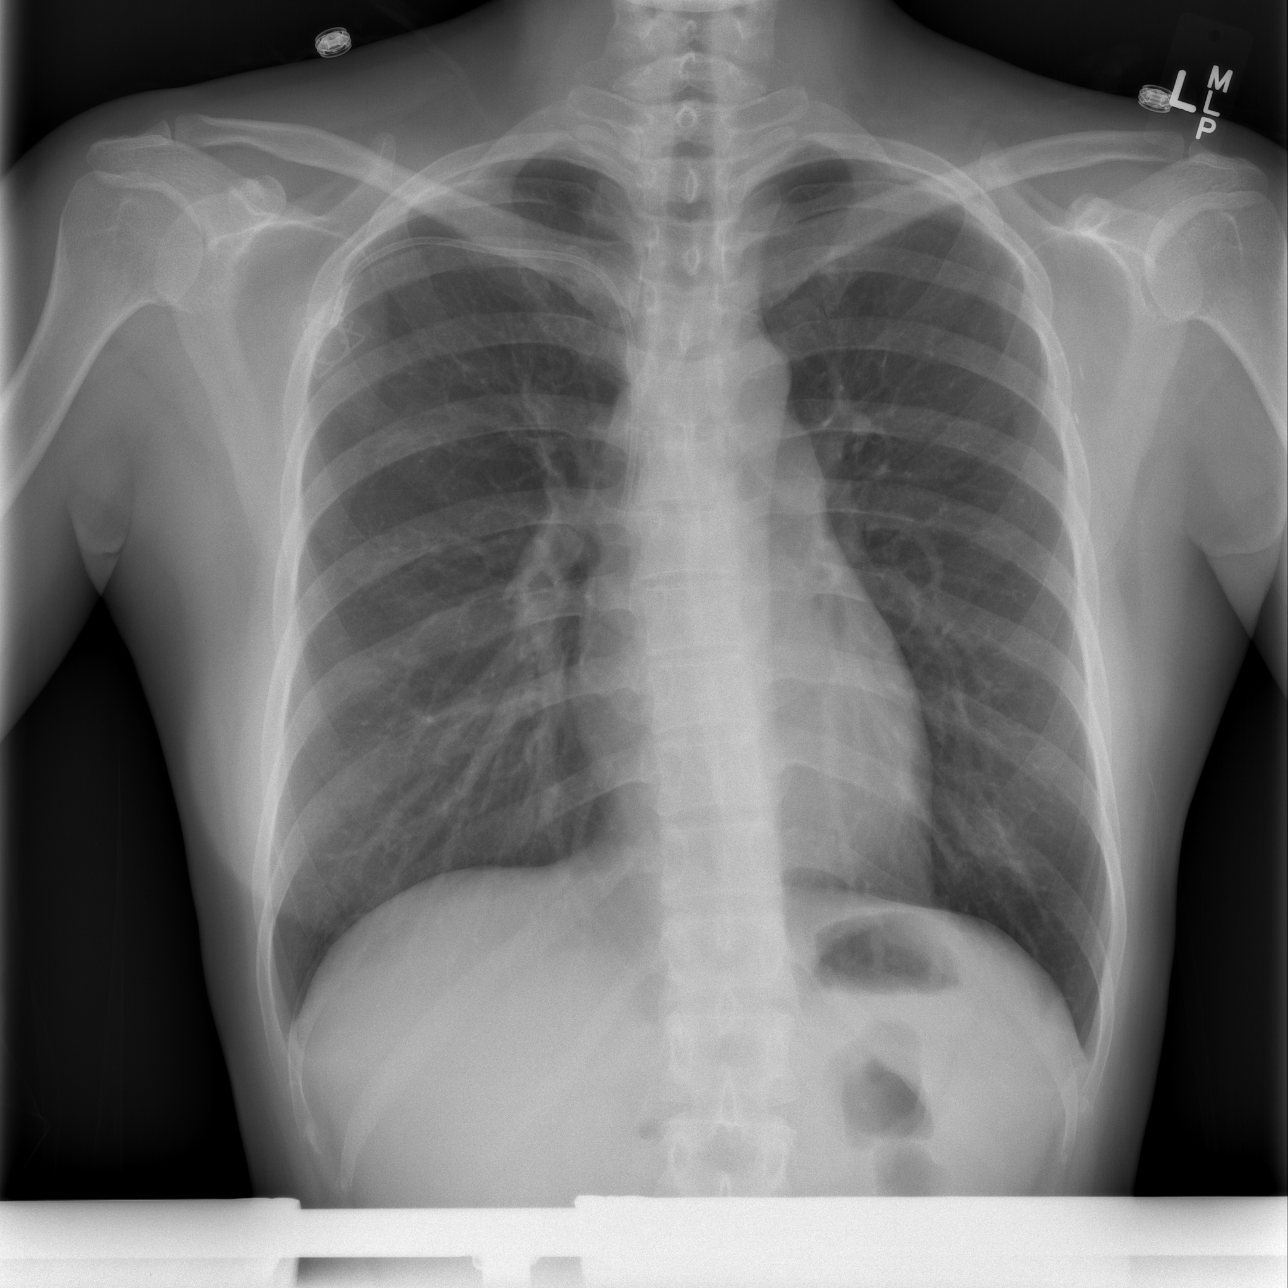

[w abdomen upright *]
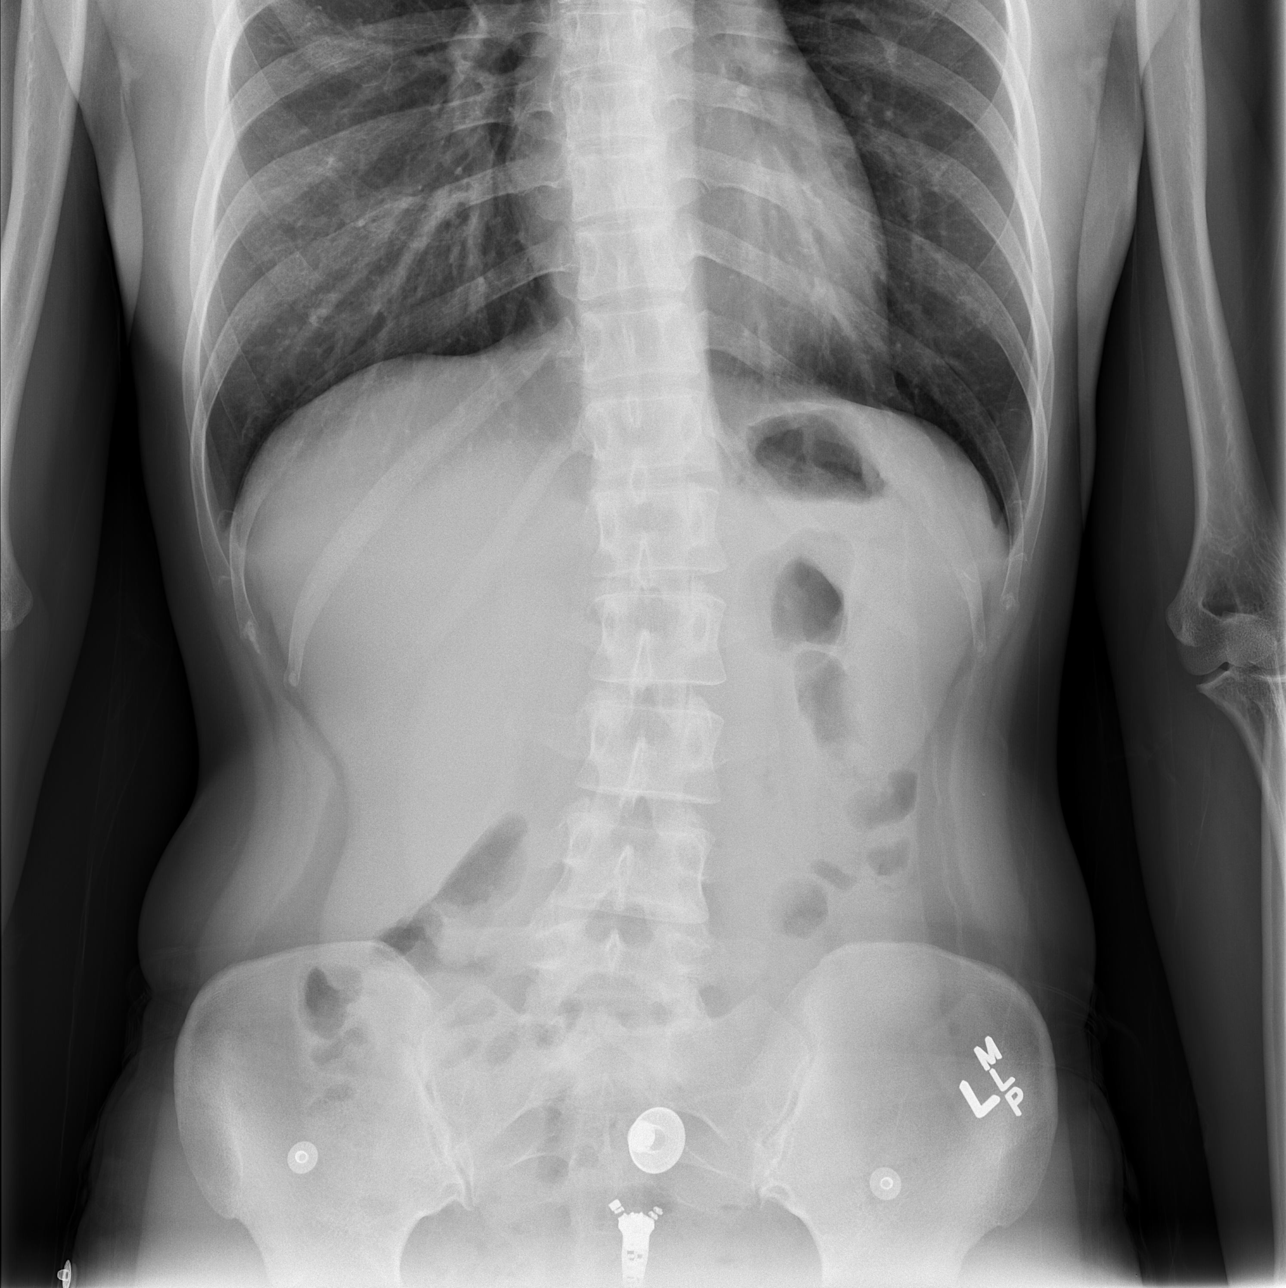

[t abdomen supine]
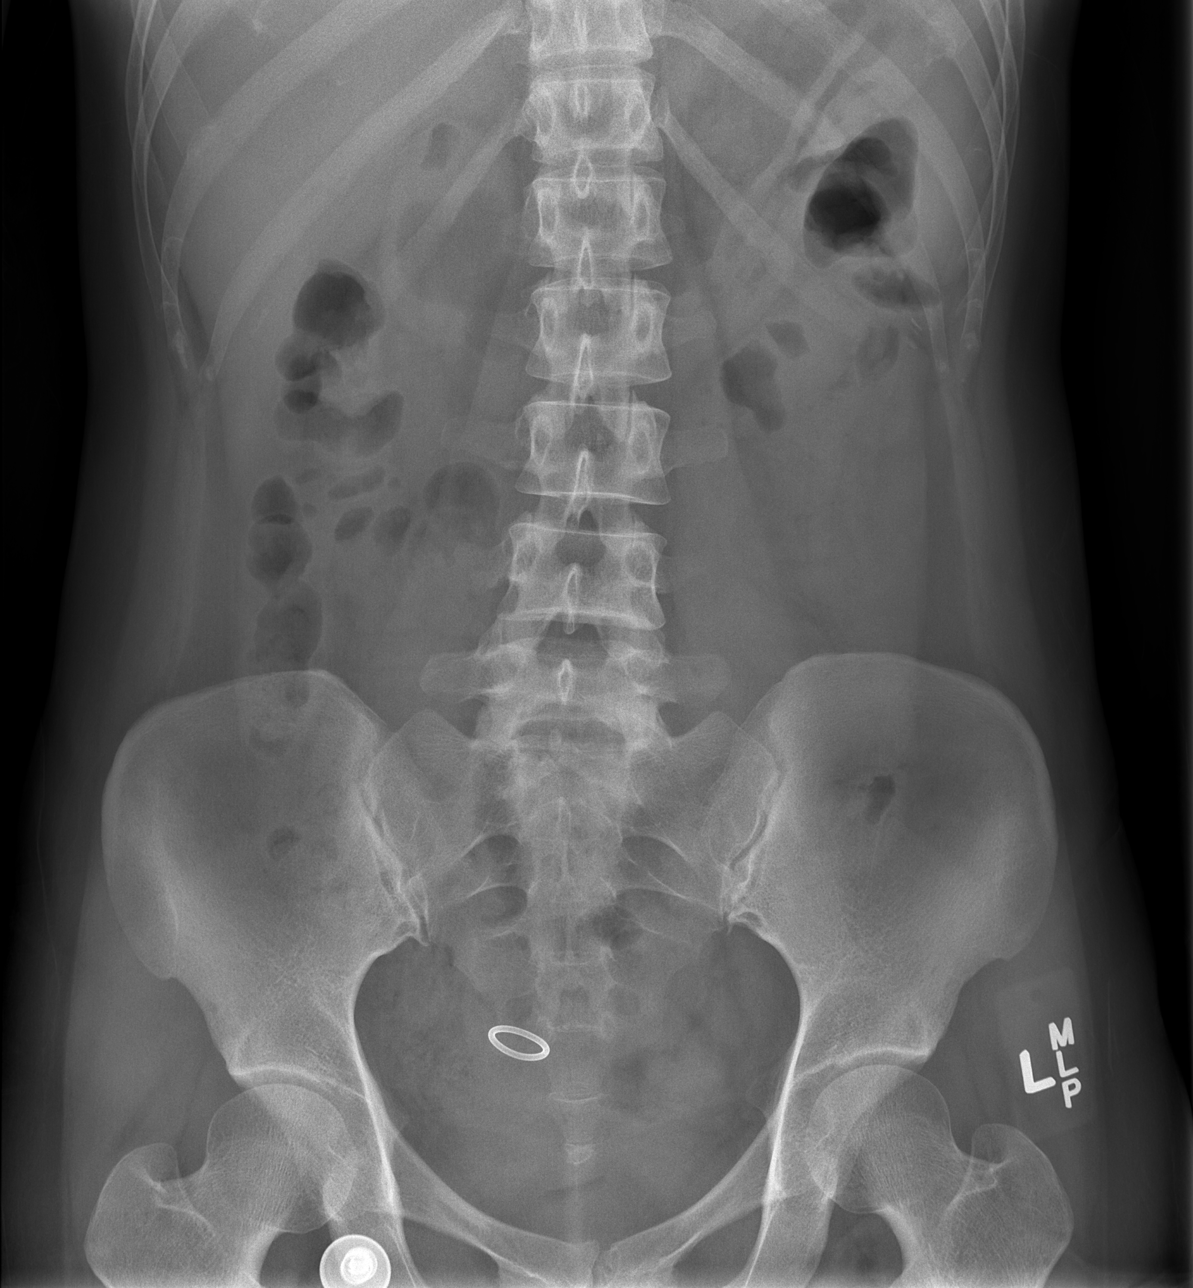

[3 of 3 positions shown; findings below may reference images not displayed]

FINDINGS: Right subclavian port catheter has been placed to the
proximal SVC.  No pneumothorax.  Lungs are clear.  Heart size
normal.  No effusion.  Vascular clips in the left axilla.

Supine erect abdomen films show no free air.  Normal bowel gas
pattern.  Regional bones unremarkable.  No abnormal abdominal
calcifications.
IMPRESSION: 1.  No acute cardiopulmonary disease.
2.  Normal bowel gas pattern appeared
3.  No free air.

## 2011-08-01 ENCOUNTER — Ambulatory Visit (INDEPENDENT_AMBULATORY_CARE_PROVIDER_SITE_OTHER): Payer: Self-pay | Admitting: Gastroenterology

## 2011-08-01 DIAGNOSIS — B181 Chronic viral hepatitis B without delta-agent: Secondary | ICD-10-CM

## 2011-08-01 LAB — HEPATIC FUNCTION PANEL
Indirect Bilirubin: 0.5 mg/dL (ref 0.0–0.9)
Total Protein: 7.2 g/dL (ref 6.0–8.3)

## 2011-08-01 LAB — PHOSPHORUS: Phosphorus: 3.2 mg/dL (ref 2.3–4.6)

## 2011-08-01 MED ORDER — TENOFOVIR DISOPROXIL FUMARATE 300 MG PO TABS: 300.0000 mg | ORAL_TABLET | Freq: Every day | ORAL | Status: DC

## 2011-08-02 LAB — HEPATITIS B SURFACE ANTIBODY,QUALITATIVE: Hep B S Ab: NEGATIVE

## 2011-08-02 LAB — HEPATITIS B SURFACE ANTIGEN: Hepatitis B Surface Ag: POSITIVE — AB

## 2011-08-05 LAB — HEPATITIS B E ANTIBODY: Hepatitis Be Antibody: POSITIVE — AB

## 2011-08-08 NOTE — Progress Notes (Signed)
Lauren Cohen, Lauren Cohen  MR#:  [id number]      DATE:  08/01/2011  DOB:  24-Jul-1970    cc: Consulting Physicians: Rob Bunting, MD, [facility], Gastroenterology, [address] Floor, [place], [place] [address], Fax [phone]  Elsie Lincoln, MD, [facility], [address] [place], [place] [address], Fax [phone]  Primary Care Physician: None.  Referring Physician:  Pierce Crane, MD, [facility], [facility], [address] [place], [place], [place] [address], Fax [phone]    REASON FOR VISIT:  Genotype B, E antigen positive hepatitis B.   HISTORY:  The patient returns today accompanied by a Nurse, learning disability. She continues on tenofovir though she finished her course of chemotherapy the first week of October 2012. There are no side effects related to the use of tenofovir. There are no symptoms related to her hepatitis B nor are there symptoms to suggest decompensated liver disease.   PAST MEDICAL HISTORY:  Significant for breast cancer. The most recent clinic followup note from 07/09/2011, was reviewed. She will be seen again in 3 months' time and undergo re-imaging in September 2013.   CURRENT MEDICATIONS:  Tenofovir 300 mg daily.   ALLERGIES:  NONE.   HABITS:  Smoking never. Alcohol denies interval consumption.   REVIEW OF SYSTEMS:  All 10 systems reviewed today with the patient via translator and they are negative other than which was mentioned above. CES-D was not completed.   PHYSICAL EXAMINATION:  Constitutional: Well appearing without stigmata of chronic liver disease. Vital Signs: Height 64.5 inches, weight 132 pounds, blood pressure 120/67, pulse of 68, temperature 96.2 Fahrenheit.   LABORATORIES:  Most recent lab work from 07/09/2011, was reviewed in addition to the previous clinic appointment lab work from 01/10/2011. There has been no interval imaging of the abdomen.   ASSESSMENT:  The patient  is a 41 year old [place] woman with a history of genotype B, E antigen positive hepatitis B, who has now been on tenofovir since 03/06/2010, or approximately 73 weeks to suppress virus while on chemotherapy for breast cancer. She has now completed chemotherapy, so consideration could be given to stopping the tenofovir. However, considering that she is E antigen positive with a fairly higher viral load when first seen, I consider it of value to continue on therapy to try to achieve an E antigen to E antibody seroconversion. Furthermore, there is now 5 year followup data suggesting toxicities are minimal.   In terms of her hepatitis B care, she does not need imaging of the liver until she reaches the age of 46, in keeping with the practice for her Asian women. She is hepatitis A immune.  In my discussion today with the patient via her translator, we discussed her previous serologies and their implications. We discussed the pros and cons of continuing on therapy until we try to achieve an E antigen to E antibody seroconversion versus stopping therapy. The patient in agreement with me that we would continue on therapy with tenofovir for now.   PLAN:  1. Standard labs including phosphorus and creatinine today.  2. Standard HBV DNA serologies today.  3. Hepatitis A immune.  4. Return in approximately 6 months' time in followup.               Brooke Dare, MD   ADDENDUM  HBV DNA undetectable.  ALT 22.  Hep B e Ab positive.  Hep B e Ag negative.  Will continue for 12 more  months and if serologies continue as they are, can stop tenofovir.  403 .[id number]  D:  Thu May 02 18:19:03 2013 ; T:  Thu May 02 23:37:14 2013  Job #:  [id number]

## 2011-10-04 IMAGING — US US ABDOMEN COMPLETE
1 series · 14 of 25 positions shown · non-contrast
Comparison: None.

CLINICAL DATA: History of hepatitis and breast carcinoma.

COMPLETE ABDOMINAL ULTRASOUND

[Series 1: us abdomen complete · 0.26mm/px · 14 of 68 slices shown]
[im 1/68]
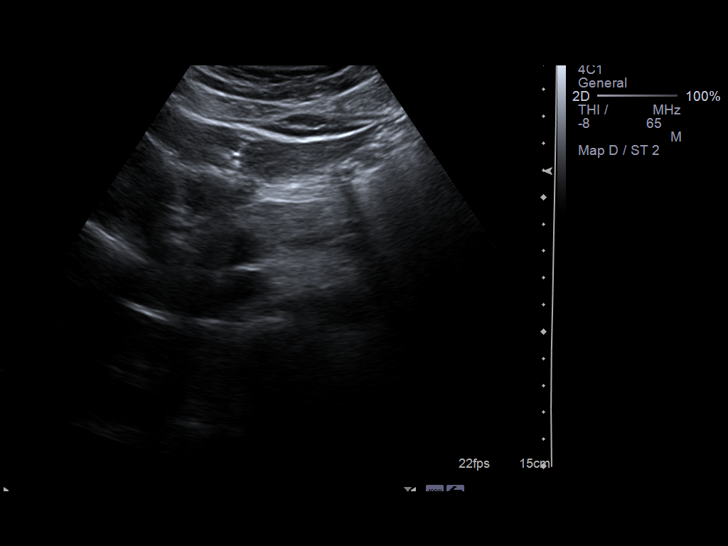
[im 6/68]
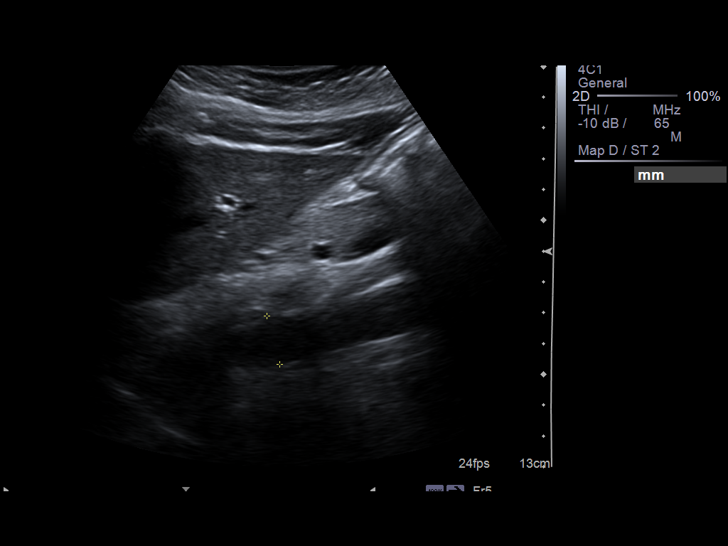
[im 12/68]
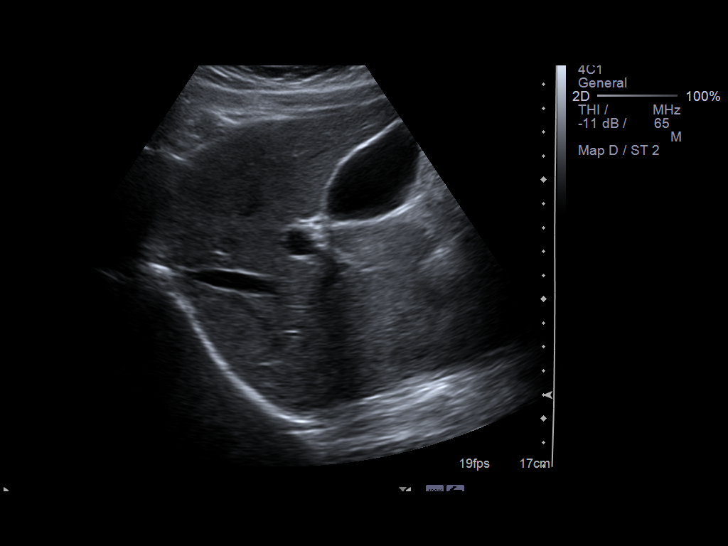
[im 17/68]
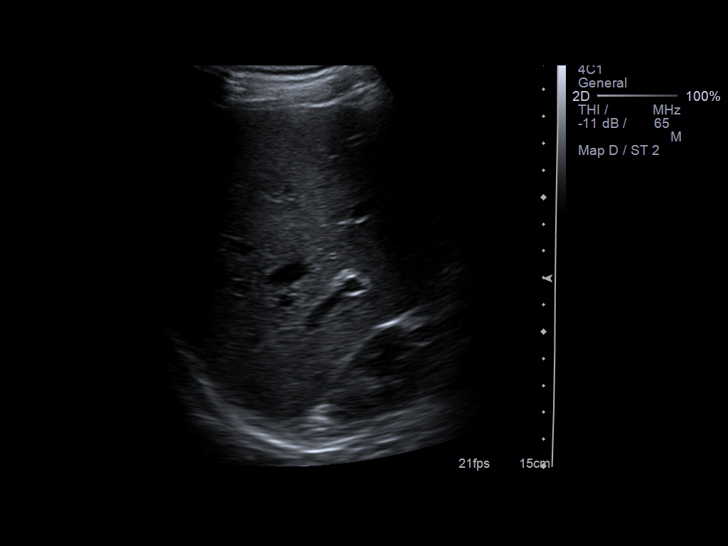
[im 23/68]
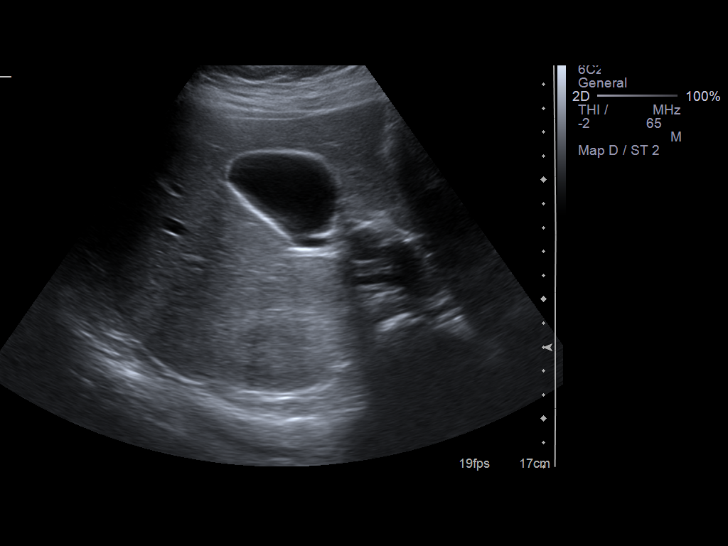
[im 26/68]
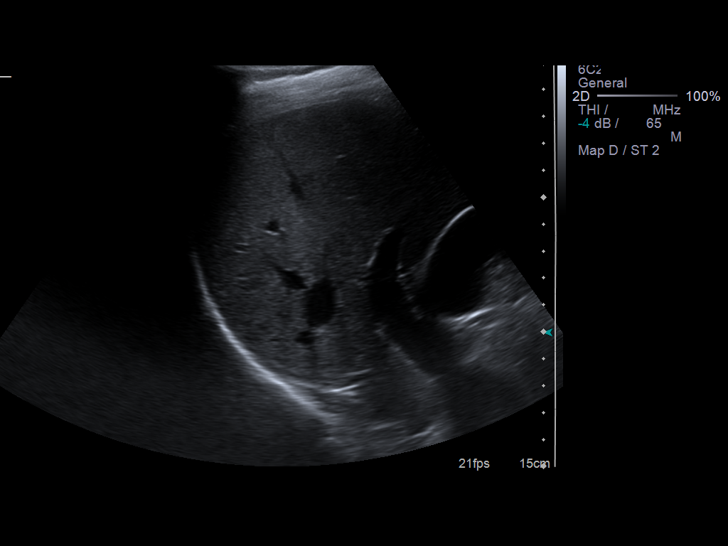
[im 31/68]
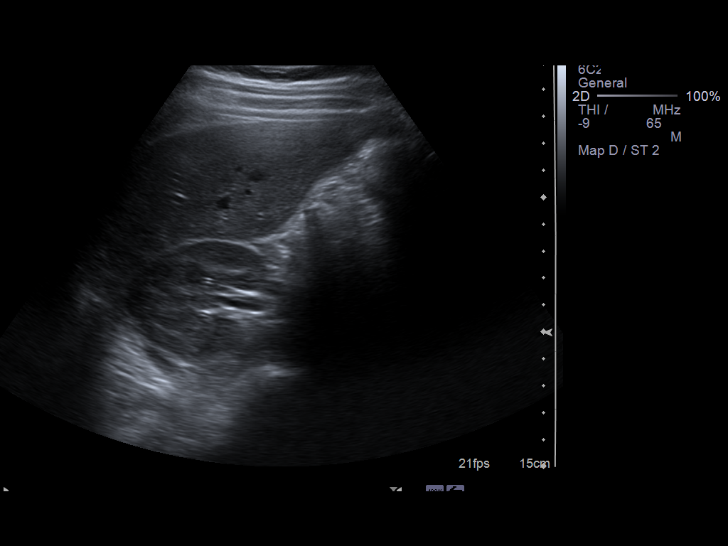
[im 37/68]
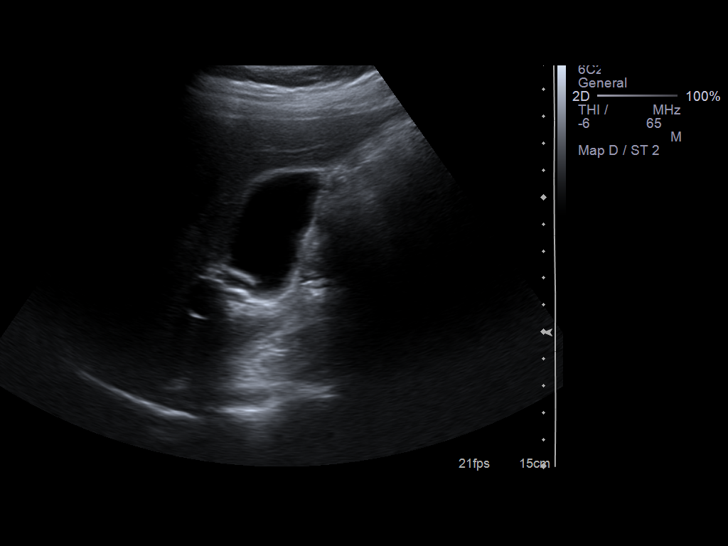
[im 42/68]
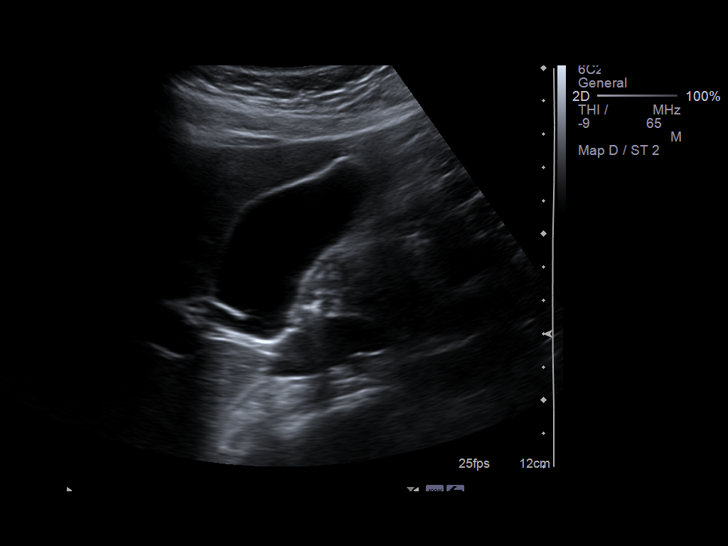
[im 45/68]
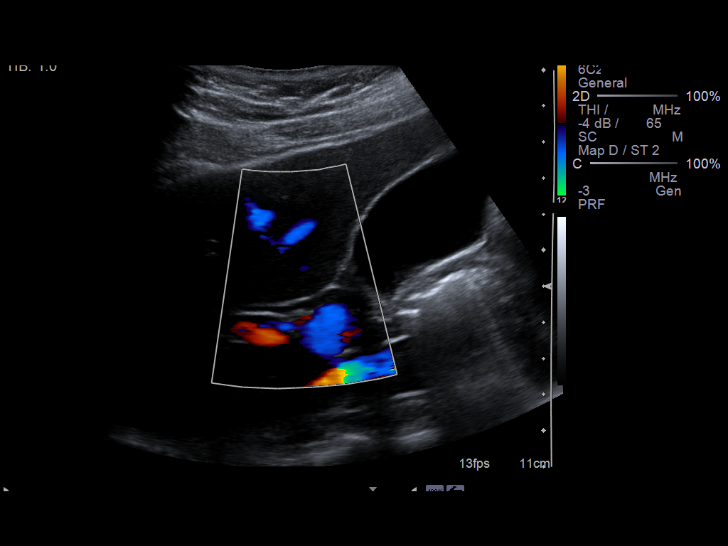
[im 51/68]
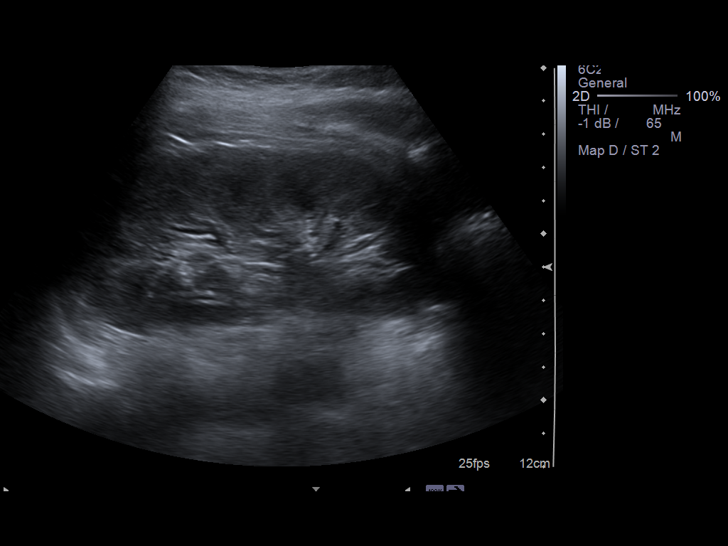
[im 56/68]
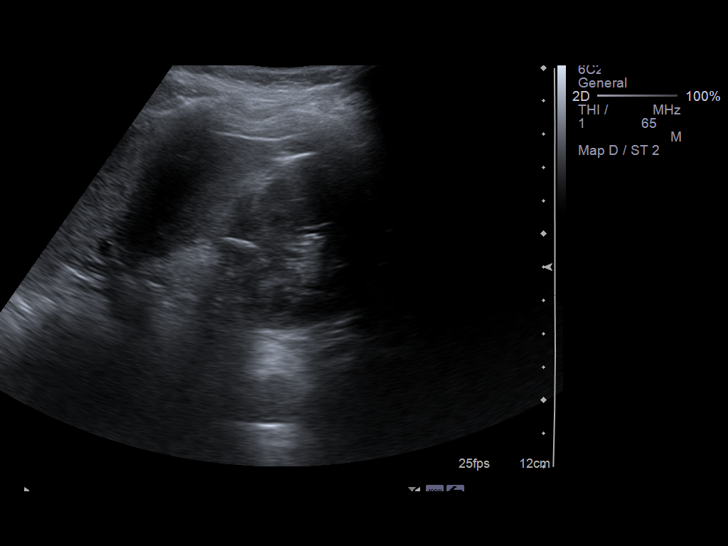
[im 62/68]
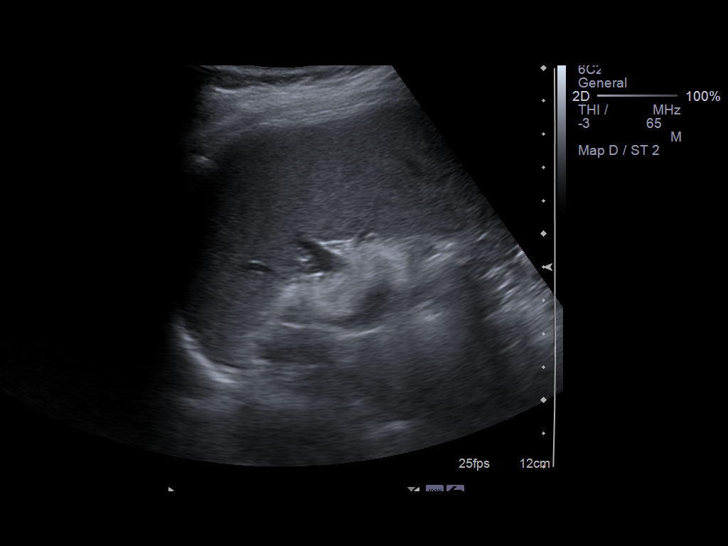
[im 68/68]
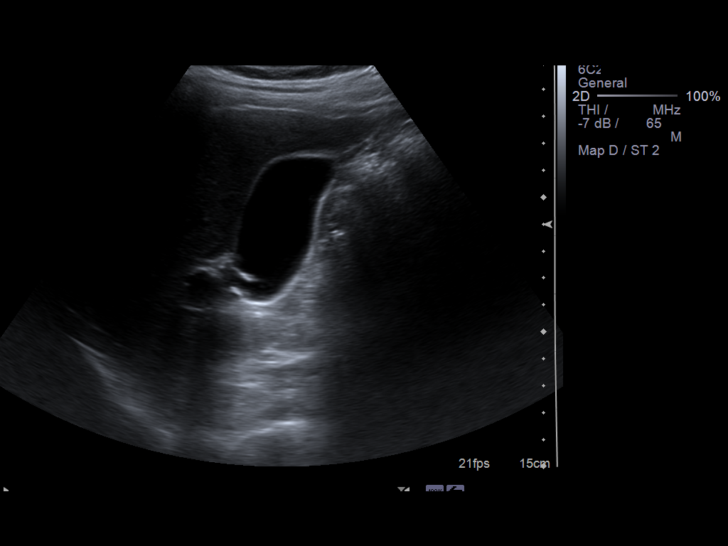

[14 of 25 positions shown; findings below may reference images not displayed]

FINDINGS: Gallbladder:  No gallstones, gallbladder wall thickening, or
pericholecystic fluid.

Common bile duct:  Measures 2.6 mm.

Liver:  No focal lesion identified.  Within normal limits in
parenchymal echogenicity.

IVC:  Appears normal.

Pancreas:  No focal abnormality seen.

Spleen:  Measures 8.4 cm and appears normal.

Right Kidney:  Measures 11.8 cm and appears normal.

Left Kidney:  Measures 11.4 cm and appears normal.

Abdominal aorta:  No aneurysm identified.
IMPRESSION: Negative abdominal ultrasound.

## 2012-02-13 ENCOUNTER — Ambulatory Visit: Payer: Self-pay | Admitting: Gastroenterology

## 2012-02-13 ENCOUNTER — Ambulatory Visit: Payer: Self-pay | Admitting: Oncology

## 2012-02-13 ENCOUNTER — Other Ambulatory Visit: Payer: Self-pay | Admitting: Lab

## 2012-02-18 ENCOUNTER — Other Ambulatory Visit (HOSPITAL_BASED_OUTPATIENT_CLINIC_OR_DEPARTMENT_OTHER): Payer: Self-pay

## 2012-02-18 ENCOUNTER — Ambulatory Visit (HOSPITAL_BASED_OUTPATIENT_CLINIC_OR_DEPARTMENT_OTHER): Payer: Self-pay | Admitting: Oncology

## 2012-02-18 ENCOUNTER — Telehealth: Payer: Self-pay | Admitting: *Deleted

## 2012-02-18 VITALS — BP 102/63 | HR 67 | Temp 97.8°F | Resp 20 | Ht 64.0 in | Wt 130.6 lb

## 2012-02-18 DIAGNOSIS — C50919 Malignant neoplasm of unspecified site of unspecified female breast: Secondary | ICD-10-CM

## 2012-02-18 DIAGNOSIS — C50419 Malignant neoplasm of upper-outer quadrant of unspecified female breast: Secondary | ICD-10-CM

## 2012-02-18 DIAGNOSIS — Z171 Estrogen receptor negative status [ER-]: Secondary | ICD-10-CM

## 2012-02-18 DIAGNOSIS — C50219 Malignant neoplasm of upper-inner quadrant of unspecified female breast: Secondary | ICD-10-CM

## 2012-02-18 DIAGNOSIS — C773 Secondary and unspecified malignant neoplasm of axilla and upper limb lymph nodes: Secondary | ICD-10-CM

## 2012-02-18 LAB — CBC WITH DIFFERENTIAL/PLATELET
Eosinophils Absolute: 0 10*3/uL (ref 0.0–0.5)
MONO#: 0.2 10*3/uL (ref 0.1–0.9)
NEUT#: 1.9 10*3/uL (ref 1.5–6.5)
RBC: 4.01 10*6/uL (ref 3.70–5.45)
RDW: 16.1 % — ABNORMAL HIGH (ref 11.2–14.5)
WBC: 3.4 10*3/uL — ABNORMAL LOW (ref 3.9–10.3)
lymph#: 1.2 10*3/uL (ref 0.9–3.3)

## 2012-02-18 LAB — COMPREHENSIVE METABOLIC PANEL (CC13)
ALT: 24 U/L (ref 0–55)
CO2: 26 mEq/L (ref 22–29)
Calcium: 9.3 mg/dL (ref 8.4–10.4)
Chloride: 108 mEq/L — ABNORMAL HIGH (ref 98–107)
Potassium: 4 mEq/L (ref 3.5–5.1)
Sodium: 139 mEq/L (ref 136–145)
Total Bilirubin: 0.61 mg/dL (ref 0.20–1.20)
Total Protein: 7 g/dL (ref 6.4–8.3)

## 2012-02-18 NOTE — Progress Notes (Signed)
Hematology and Oncology Follow Up Visit  Lauren Cohen [id number] 09/10/1970 41 y.o. 02/18/2012 12:54 PM PCP  Principle Diagnosis:  A 41 year old [place], [place], woman of [place] descent with:  1. 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  She completed 6 cycles of q.3 week Taxotere, carboplatin and Herceptin with weekly Herceptin.  She then completed radiation therapy on 08/17/2010 with weekly Herceptin given through the duration.  She  completed her maintenance q.3 week doses of Herceptin on 10/12.  2. History of duodenal ulcer, prior Carafate use, on Protonix 40 mg per day.   3. History of hepatitis B on Viread followed by Dr. Alessandra Grout.   Interim History:  There have been no intercurrent illness, hospitalizations or medication changes.she has occasional vertigo when she gets up from a seated position. She has not had a f/u mammogram.  Medications: I have reviewed the patient's current medications.  Allergies: No Known Allergies  Past Medical History, Surgical history, Social history, and Family History were reviewed and updated.  Review of Systems: Constitutional:  Negative for fever, chills, night sweats, anorexia, weight loss, pain. Cardiovascular: no chest pain or dyspnea on exertion Respiratory: no cough, shortness of breath, or wheezing Neurological: negative Dermatological: negative, noted lt breast/implant discomfort on palpation ENT: negative Skin Gastrointestinal: no abdominal pain, change in bowel habits, or black or bloody stools Genito-Urinary: no dysuria, trouble voiding, or hematuria Hematological and Lymphatic: negative Breast: negative Musculoskeletal: positive for - pain in finger - bilateral Remaining  ROS negative.  Physical Exam: Blood pressure 102/63, pulse 67, temperature 97.8 F (36.6 C), temperature source Oral, resp. rate 20, height 5\' 4"  (1.626 m), weight 130 lb 9.6 oz (59.24 kg). ECOG: 0 General appearance: alert, cooperative and appears stated age Head: Normocephalic, without obvious abnormality, atraumatic Neck: no adenopathy, no carotid bruit, no JVD, supple, symmetrical, trachea midline and thyroid not enlarged, symmetric, no tenderness/mass/nodules Lymph nodes: Cervical, supraclavicular, and axillary nodes normal. Cardiac : regular rate and rhythm, no murmurs or gallops Pulmonary:clear to auscultation bilaterally and normal percussion bilaterally Breasts: inspection negative, no nipple discharge or bleeding, no masses or nodularity palpable lt brst s/p mrm with implant, no masses Abdomen:soft, non-tender; bowel sounds normal; no masses,  no organomegaly Extremities negative Neuro: alert, oriented, normal speech, no focal findings or movement disorder noted,  Lab Results: Lab Results  Component Value Date   WBC 3.4* 02/18/2012   HGB 10.5* 02/18/2012   HCT 32.3* 02/18/2012   MCV 80.4 02/18/2012   PLT 192 02/18/2012     Chemistry      Component Value Date/Time   NA 139 02/18/2012 1146   NA 137 07/09/2011 1342   K 4.0 02/18/2012 1146   K 4.0 07/09/2011 1342   CL 108* 02/18/2012 1146   CL 106 07/09/2011 1342   CO2 26 02/18/2012 1146   CO2 25 07/09/2011 1342   BUN 11.0 02/18/2012 1146   BUN 10 07/09/2011 1342   CREATININE 0.7 02/18/2012 1146   CREATININE 0.67 08/01/2011 1456   CREATININE 0.66 07/09/2011 1342      Component Value Date/Time   CALCIUM 9.3 02/18/2012 1146   CALCIUM 8.9 07/09/2011 1342   ALKPHOS 94 02/18/2012 1146   ALKPHOS 82 08/01/2011 1456   AST 29 02/18/2012 1146   AST 26 08/01/2011  1456   ALT 24 02/18/2012 1146   ALT 22 08/01/2011 1456   BILITOT 0.61 02/18/2012 1146   BILITOT 0.6 08/01/2011 1456      .pathology. Radiological Studies: chest  X-ray n/a Mammogram 9/13-will need to schedule.  Bone density n/a  Impression and Plan: She is doing well, w/out evidence for recurrence. F/u 4 months. We will set her up with the new MDs at the hep c clinic.  More than 50% of the visit was spent in patient-related counselling   Pierce Crane, MD 11/19/201312:54 PM

## 2012-02-18 NOTE — Telephone Encounter (Signed)
Gave patient appointment for 2014  Faxed over to have them call the patient requesting Tuesday only in the afternoon

## 2012-02-18 NOTE — Progress Notes (Signed)
Hematology and Oncology Follow Up Visit  Lauren Cohen [id number] 07/23/1970 41 y.o. 02/18/2012 1:03 PM PCP  Principle Diagnosis:  A 41 year old [place], [place], woman of [place] descent with:  1. 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  She completed 6 cycles of q.3 week Taxotere, carboplatin and Herceptin with weekly Herceptin.  She then completed radiation therapy on 08/17/2010 with weekly Herceptin given through the duration.  She  completed her maintenance q.3 week doses of Herceptin on 10/12.  2. History of duodenal ulcer, prior Carafate use, on Protonix 40 mg per day.   3. History of hepatitis B on Viread followed by Dr. Alessandra Grout.   Interim History:  There have been no intercurrent illness, hospitalizations or medication changes.she has occasional vertigo when she gets up from a seated position.  Medications: I have reviewed the patient's current medications.  Allergies: No Known Allergies  Past Medical History, Surgical history, Social history, and Family History were reviewed and updated.  Review of Systems: Constitutional:  Negative for fever, chills, night sweats, anorexia, weight loss, pain. Cardiovascular: no chest pain or dyspnea on exertion Respiratory: no cough, shortness of breath, or wheezing Neurological: negative Dermatological: negative, noted lt breast/implant discomfort on palpation ENT: negative Skin Gastrointestinal: no abdominal pain, change in bowel habits, or black or bloody stools Genito-Urinary: no dysuria, trouble voiding, or hematuria Hematological and Lymphatic: negative Breast: negative Musculoskeletal: positive for - pain in finger - bilateral Remaining ROS negative.  Physical  Exam: Blood pressure 102/63, pulse 67, temperature 97.8 F (36.6 C), temperature source Oral, resp. rate 20, height 5\' 4"  (1.626 m), weight 130 lb 9.6 oz (59.24 kg). ECOG: 0 General appearance: alert, cooperative and appears stated age Head: Normocephalic, without obvious abnormality, atraumatic Neck: no adenopathy, no carotid bruit, no JVD, supple, symmetrical, trachea midline and thyroid not enlarged, symmetric, no tenderness/mass/nodules Lymph nodes: Cervical, supraclavicular, and axillary nodes normal. Cardiac : regular rate and rhythm, no murmurs or gallops Pulmonary:clear to auscultation bilaterally and normal percussion bilaterally Breasts: inspection negative, no nipple discharge or bleeding, no masses or nodularity palpable lt brst s/p mrm with implant, no masses Abdomen:soft, non-tender; bowel sounds normal; no masses,  no organomegaly Extremities negative Neuro: alert, oriented, normal speech, no focal findings or movement disorder noted,  Lab Results: Lab Results  Component Value Date   WBC 3.4* 02/18/2012   HGB 10.5* 02/18/2012   HCT 32.3* 02/18/2012   MCV 80.4 02/18/2012   PLT 192 02/18/2012     Chemistry      Component Value Date/Time   NA 139 02/18/2012 1146   NA 137 07/09/2011 1342   K 4.0 02/18/2012 1146   K 4.0 07/09/2011 1342   CL 108* 02/18/2012 1146   CL 106 07/09/2011 1342   CO2 26 02/18/2012 1146   CO2 25 07/09/2011 1342   BUN 11.0 02/18/2012 1146   BUN 10 07/09/2011 1342   CREATININE 0.7 02/18/2012 1146   CREATININE 0.67 08/01/2011 1456   CREATININE 0.66 07/09/2011 1342      Component Value Date/Time   CALCIUM 9.3 02/18/2012 1146   CALCIUM 8.9 07/09/2011 1342   ALKPHOS 94 02/18/2012 1146   ALKPHOS 82 08/01/2011 1456   AST 29 02/18/2012 1146   AST 26 08/01/2011 1456   ALT 24 02/18/2012 1146  ALT 22 08/01/2011 1456   BILITOT 0.61 02/18/2012 1146   BILITOT 0.6 08/01/2011 1456      .pathology. Radiological Studies: chest X-ray n/a Mammogram 9/13  Bone  density n/a  Impression and Plan: She is doing well, w/out evidence for recurrence. We will monitor her carefully as she is high risk for recurrence.will hold on CNS imaging for now, f/u mammogram in 9/13. F/u 3 months.  More than 50% of the visit was spent in patient-related counselling   Pierce Crane, MD 11/19/20131:03 PM

## 2012-02-19 LAB — CANCER ANTIGEN 27.29: CA 27.29: 10 U/mL (ref 0–39)

## 2012-03-31 ENCOUNTER — Ambulatory Visit: Payer: Self-pay

## 2012-04-14 ENCOUNTER — Ambulatory Visit: Payer: Self-pay

## 2012-04-14 ENCOUNTER — Other Ambulatory Visit (INDEPENDENT_AMBULATORY_CARE_PROVIDER_SITE_OTHER): Payer: Self-pay | Admitting: Surgery

## 2012-04-14 ENCOUNTER — Other Ambulatory Visit: Payer: Self-pay | Admitting: Emergency Medicine

## 2012-04-14 ENCOUNTER — Encounter (INDEPENDENT_AMBULATORY_CARE_PROVIDER_SITE_OTHER): Payer: Self-pay | Admitting: Surgery

## 2012-04-14 ENCOUNTER — Ambulatory Visit (INDEPENDENT_AMBULATORY_CARE_PROVIDER_SITE_OTHER): Payer: Self-pay | Admitting: Surgery

## 2012-04-14 VITALS — BP 112/60 | HR 84 | Temp 97.4°F | Resp 16 | Ht 65.0 in | Wt 131.2 lb

## 2012-04-14 DIAGNOSIS — Z853 Personal history of malignant neoplasm of breast: Secondary | ICD-10-CM

## 2012-04-14 DIAGNOSIS — N63 Unspecified lump in unspecified breast: Secondary | ICD-10-CM

## 2012-04-14 NOTE — Progress Notes (Signed)
Subjective:     Patient ID: Lauren Cohen, female   DOB: 12/13/1970, 42 y.o.   MRN: [id number]  HPI A 42 year old Lauren Cohen, [place], woman of [place] descent with:  1. 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 She completed 6 cycles of q.3 week Taxotere, carboplatin and Herceptin with weekly Herceptin. She then completed radiation therapy on 08/17/2010 with weekly Herceptin given through the duration. She completed her maintenance q.3 week doses of Herceptin on 10/12.  2. History of duodenal ulcer, prior Carafate use, on Protonix 40 mg per day.  3. History of hepatitis B on Viread followed by Dr. Alessandra Grout. She has a history of silicone injection under her pectoralis muscles in [place] in the past.  Pt has noticed an area over her sternum that is soft and movable.  Not very tender.  Not hard.  Feels like silicone.    Review of Systems  Constitutional: Negative.   HENT: Negative.   Cardiovascular: Negative.        Objective:   Physical Exam  Constitutional: She is oriented to person, place, and time. She appears well-developed and well-nourished.  HENT:  Head: Normocephalic and atraumatic.  Eyes: EOM are normal. Pupils are equal, round, and reactive to light.  Neck: Normal range of motion.  Pulmonary/Chest:    Neurological: She is alert and oriented to person, place, and time.  Skin: Skin is warm and dry.  Psychiatric: She has a normal mood and affect. Her behavior is normal. Judgment and thought content normal.       Assessment:     A 42 year old [address], [place], woman of [place] descent with:  4. 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 She completed 6 cycles of q.3 week Taxotere, carboplatin and Herceptin with weekly Herceptin. She then completed radiation therapy on 08/17/2010 with weekly Herceptin given through the duration. She completed her maintenance q.3 week doses of Herceptin on 10/12.  5. History of duodenal ulcer, prior Carafate use, on Protonix 40 mg per day.  6. History of hepatitis B on Viread followed by Dr. Alessandra Grout.       Plan:     Will need MRI to evaluate implant.  No signs of recurrent cancer.  Plastic surgery consult if implant is to be removed.

## 2012-04-14 NOTE — Patient Instructions (Addendum)
Return 1 year.  Will need MRI to evaluate implant and history of breast cancer. Need to Dr Kelly Splinter for consultation about implants.

## 2012-04-15 ENCOUNTER — Telehealth (INDEPENDENT_AMBULATORY_CARE_PROVIDER_SITE_OTHER): Payer: Self-pay

## 2012-04-15 NOTE — Telephone Encounter (Signed)
LMOM with interpreter and gave her appt info. Pt scheduled for [place] and MM on 2/4 @ 1:00 at the breast center. I also gave her number [phone] for pt to call for assistance paying for MM since she has no insurance. I also to her to make sure she has interpreter with he for appt.

## 2012-04-27 ENCOUNTER — Encounter (INDEPENDENT_AMBULATORY_CARE_PROVIDER_SITE_OTHER): Payer: Self-pay | Admitting: Surgery

## 2012-05-05 ENCOUNTER — Ambulatory Visit
Admission: RE | Admit: 2012-05-05 | Discharge: 2012-05-05 | Disposition: A | Discharge status: Home / Self Care | Payer: Self-pay | Source: Ambulatory Visit | Attending: Surgery | Admitting: Surgery

## 2012-05-05 DIAGNOSIS — N63 Unspecified lump in unspecified breast: Secondary | ICD-10-CM

## 2012-05-05 DIAGNOSIS — Z853 Personal history of malignant neoplasm of breast: Secondary | ICD-10-CM

## 2012-05-06 ENCOUNTER — Other Ambulatory Visit: Payer: Self-pay

## 2012-05-09 ENCOUNTER — Telehealth: Payer: Self-pay | Admitting: *Deleted

## 2012-05-09 NOTE — Telephone Encounter (Signed)
Per patient reassignment I have attempted to contact the patient. The wasn't home but I left a message with her son to have her call the office on Monday. Patient to be transferred to Dr. Darnelle Catalan. JMW

## 2012-05-22 ENCOUNTER — Telehealth: Payer: Self-pay | Admitting: *Deleted

## 2012-05-22 NOTE — Telephone Encounter (Signed)
Called and spoke with patient to reschedule her appt. Confirmed appt. For 06/16/12 at 1pm with Bernell List. Then will become Dr. Darnelle Catalan.

## 2012-06-08 ENCOUNTER — Encounter: Payer: Self-pay | Admitting: Oncology

## 2012-06-08 ENCOUNTER — Encounter: Payer: Self-pay | Admitting: *Deleted

## 2012-06-08 NOTE — Progress Notes (Signed)
Mailed letter & calendar to pt. 

## 2012-06-16 ENCOUNTER — Ambulatory Visit: Payer: Self-pay

## 2012-06-16 ENCOUNTER — Other Ambulatory Visit: Payer: Self-pay | Admitting: Lab

## 2012-06-16 ENCOUNTER — Ambulatory Visit (HOSPITAL_BASED_OUTPATIENT_CLINIC_OR_DEPARTMENT_OTHER): Payer: Self-pay | Admitting: Family

## 2012-06-16 ENCOUNTER — Encounter: Payer: Self-pay | Admitting: Family

## 2012-06-16 ENCOUNTER — Ambulatory Visit (HOSPITAL_BASED_OUTPATIENT_CLINIC_OR_DEPARTMENT_OTHER): Payer: Self-pay | Admitting: Lab

## 2012-06-16 ENCOUNTER — Ambulatory Visit: Payer: Self-pay | Admitting: Oncology

## 2012-06-16 ENCOUNTER — Telehealth: Payer: Self-pay | Admitting: *Deleted

## 2012-06-16 VITALS — BP 105/70 | HR 85 | Temp 97.8°F | Resp 20 | Ht 65.0 in | Wt 131.8 lb

## 2012-06-16 DIAGNOSIS — C773 Secondary and unspecified malignant neoplasm of axilla and upper limb lymph nodes: Secondary | ICD-10-CM

## 2012-06-16 DIAGNOSIS — Z853 Personal history of malignant neoplasm of breast: Secondary | ICD-10-CM

## 2012-06-16 DIAGNOSIS — IMO0002 Reserved for concepts with insufficient information to code with codable children: Secondary | ICD-10-CM

## 2012-06-16 DIAGNOSIS — N898 Other specified noninflammatory disorders of vagina: Secondary | ICD-10-CM

## 2012-06-16 DIAGNOSIS — C50219 Malignant neoplasm of upper-inner quadrant of unspecified female breast: Secondary | ICD-10-CM

## 2012-06-16 LAB — LIPID PANEL
Cholesterol: 168 mg/dL (ref 0–200)
LDL Cholesterol: 72 mg/dL (ref 0–99)
Total CHOL/HDL Ratio: 4.7 Ratio
VLDL: 60 mg/dL — ABNORMAL HIGH (ref 0–40)

## 2012-06-16 LAB — CBC WITH DIFFERENTIAL/PLATELET
EOS%: 1.7 % (ref 0.0–7.0)
Eosinophils Absolute: 0.1 10*3/uL (ref 0.0–0.5)
MCH: 26.4 pg (ref 25.1–34.0)
MCV: 80.7 fL (ref 79.5–101.0)
MONO%: 7.7 % (ref 0.0–14.0)
NEUT#: 2.6 10*3/uL (ref 1.5–6.5)
RBC: 4.35 10*6/uL (ref 3.70–5.45)
RDW: 15.8 % — ABNORMAL HIGH (ref 11.2–14.5)

## 2012-06-16 LAB — COMPREHENSIVE METABOLIC PANEL (CC13)
ALT: 29 U/L (ref 0–55)
AST: 29 U/L (ref 5–34)
Albumin: 3.4 g/dL — ABNORMAL LOW (ref 3.5–5.0)
Alkaline Phosphatase: 96 U/L (ref 40–150)
BUN: 10.9 mg/dL (ref 7.0–26.0)
Potassium: 3.8 mEq/L (ref 3.5–5.1)
Sodium: 141 mEq/L (ref 136–145)

## 2012-06-16 NOTE — Progress Notes (Signed)
[facility]  [phone] [phone]  OFFICE PROGRESS NOTE   ID: Lauren Cohen   DOB: Aug 29, 1970  MR#: [id number]  MWU#:[id number]   SU:  Thomas A. Cornett, MD OTHER MD: Aris Lot, MD   HISTORY OF PRESENT ILLNESS: From [name]'s new patient evaluation dated 09/13/2009:  "This is a pleasant 42 year old woman of Chinese extraction here today with an interpreter for evaluation and discussion of recent diagnosis of breast cancer. 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  She apparently has had a problem with a nipple discharge for a period of a year.  She had imaging studies at [facility] in [place] and subsequently had an FNA done at [facility].  Per history, she has been having nipple discharge for intermittently over a year.  She had implants placed as well.  Prior to that, the FNA reports suggested the presence of normal calcifications in ducts at the 11 o'clock position of the left breast 2.0 cm from the nipple.  She appeared to also have an abnormality adjacent to that.  On the 4th of April, she had an FNA of the abnormal area at 18 o' clock, 2.0 cm from the nipple, one smear was diagnostic and showed atypical cells consistent with a diagnosis of adenocarcinoma. There was an additional aspirate, which showed fibrocystic changes.  The patient returned and wished to have her further workup in [place].  She subsequently had another mammogram pre-MRI done on 08/08/2009, which showed subpectoral implants.  This plate showed heterogeneous dense breast tissue and no dominant masses were seen.  The MRI scan from 08/08/2009 showed that in the upper inner subarea of the left breast at 9:30 is a 1.2 x 1.1 x 0.6 cm ill-defined enhancing mass seen, which appeared to correspond to the  calcification seen in the mammograms from [place], posterior and medially and upper inner left breast at 10 o'clock position a 1.2 x 1.2 x 0.6 cm enhancing mass was seen.  It was not clear whether this represented an additional area that was previously described at the [place] mammograms, as we do not have the copies of those reports.  Of note is that the patient had suspicious microcalcifications of the left nipple seen as well.  There was injected material seen in the subpectoral areas of both breasts.  The patient has been initially seen by Dr. Donell Beers and now Dr. Luisa Hart and the plan is to have her undergo a mastectomy of the left breast with reconstruction and insertion of new implants." Her subsequent history is as detailed below.  INTERVAL HISTORY: Dr. Darnelle Catalan and I saw Lauren Cohen today for followup of invasive ductal carcinoma of the left breast. Lauren Cohen is accompanied by her  interpreter Connye Burkitt.  Her native language is [place].  Lauren Cohen is establishing herself with Dr. Darrall Dears service today.  REVIEW OF SYSTEMS: A 10 point review of systems was completed. Lauren Cohen had a digital diagnostic mammogram and subsequent left breast ultrasound on 05/05/12, both of which demonstrated a seroma in the left mastectomy bed that extended across the midline into the right parasternal region and has palpable fluctuance. This was of great concern to Lauren Cohen as she does have a history of a retropectoral silicone breast implant. The patient also has complaints of incomplete emptying  when she has a bowel movement. The patient states that she had an abortion on 04/02/2012 had an office and [place] in which she has experienced several days of intermittent heavy bleeding post operatively. The patient denies any other symptomatology.  PAST MEDICAL HISTORY: Past Medical History  Diagnosis Date  . Hepatitis B infection   . Breast cancer 2011  . 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 Duodenal ulcer   .  S/P left mastectomy 2011  . Peptic ulcer disease   . Gastritis   . Anemia   . GI bleed      PAST SURGICAL HISTORY: Past Surgical History  Procedure Laterality Date  . Left modified mastectomy  2011  . Portacath placement    . Mastectomy    . Port-a-cath removal  02/04/2011    Procedure: REMOVAL PORT-A-CATH;  Surgeon: Clovis Pu. Cornett, MD;  Location: [facility] OR;  Service: General;  Laterality: N/A;    FAMILY HISTORY Family History  Problem Relation Age of Onset  . Hepatitis Mother      GYNECOLOGIC HISTORY: Gravida 4, para 2, Menarche age 58,  premenopausal.  SOCIAL HISTORY: Married for 20 years, two teenaged sons age 31 and 35.  Her and her husband work in [employer]. She enjoys working on the computer and watching TV in her spare time.   ADVANCED DIRECTIVES: Not on file  HEALTH MAINTENANCE: History  Substance Use Topics  . Smoking status: Never Smoker   . Smokeless tobacco: Never Used  . Alcohol Use: No    Colonoscopy: The patient has never had a colonoscopy PAP: Her last Pap smear was on 12/17/2010 Bone density: The patient has never had a bone density scan Lipid panel: A lipid panel was ordered today.  No Known Allergies  Current Outpatient Prescriptions  Medication Sig Dispense Refill  . tenofovir (VIREAD) 300 MG tablet Take 1 tablet (300 mg total) by mouth daily.  30 tablet  5   No current facility-administered medications for this visit.    OBJECTIVE: Filed Vitals:   06/16/12 1310  BP: 105/70  Pulse: 85  Temp: 97.8 F (36.6 C)  Resp: 20     Body mass index is 21.93 kg/(m^2).      ECOG FS:  Grade 1 - Symptomatic, but fully ambulatory           General appearance: Alert, cooperative, well nourished, thin frame, no apparent distress Head: Normocephalic, without obvious abnormality, atraumatic Eyes: Conjunctivae/corneas clear, PERRLA, EOMI Nose: Nares, septum and mucosa are normal, no drainage or sinus tenderness Neck: No adenopathy, supple,  symmetrical, trachea midline, thyroid not enlarged, no tenderness Resp: Clear to auscultation bilaterally Cardio: Regular rate and rhythm, S1, S2 normal, no murmur, click, rub or gallop Breasts: Soft bilaterally, reconstructed left breast with well-healed surgical scar, the left breast does not have a nipple/areola area, no lymphadenopathy, no nipple inversion on the right breast, no axilla fullness, the patient has lower midsternal fluctuance between both breasts that is palpable GI: Soft, not distended, non-tender, hypoactive bowel sounds, no organomegaly Extremities: Extremities normal, atraumatic, no cyanosis or edema Lymph nodes: Cervical, supraclavicular, and axillary nodes normal Neurologic: Grossly normal   LAB RESULTS: Results for orders placed in visit on 06/16/12 (from the past 168 hour(s))  CBC WITH DIFFERENTIAL   Collection Time    06/16/12  3:14 PM      Result Value Range   WBC 4.9  3.9 - 10.3 10e3/uL   NEUT# 2.6  1.5 - 6.5 10e3/uL  HGB 11.5 (*) 11.6 - 15.9 g/dL   HCT 81.1  91.4 - 78.2 %   Platelets 211  145 - 400 10e3/uL   MCV 80.7  79.5 - 101.0 fL   MCH 26.4  25.1 - 34.0 pg   MCHC 32.7  31.5 - 36.0 g/dL   RBC 9.56  2.13 - 0.86 10e6/uL   RDW 15.8 (*) 11.2 - 14.5 %   lymph# 1.8  0.9 - 3.3 10e3/uL   MONO# 0.4  0.1 - 0.9 10e3/uL   Eosinophils Absolute 0.1  0.0 - 0.5 10e3/uL   Basophils Absolute 0.1  0.0 - 0.1 10e3/uL   NEUT% 52.9  38.4 - 76.8 %   LYMPH% 36.6  14.0 - 49.7 %   MONO% 7.7  0.0 - 14.0 %   EOS% 1.7  0.0 - 7.0 %   BASO% 1.1  0.0 - 2.0 %  COMPREHENSIVE METABOLIC PANEL (CC13)   Collection Time    06/16/12  3:14 PM      Result Value Range   Sodium 141  136 - 145 mEq/L   Potassium 3.8  3.5 - 5.1 mEq/L   Chloride 107  98 - 107 mEq/L   CO2 27  22 - 29 mEq/L   Glucose 103 (*) 70 - 99 mg/dl   BUN 57.8  7.0 - 46.9 mg/dL   Creatinine 0.8  0.6 - 1.1 mg/dL   Total Bilirubin 6.29  0.20 - 1.20 mg/dL   Alkaline Phosphatase 96  40 - 150 U/L   AST 29  5 - 34 U/L    ALT 29  0 - 55 U/L   Total Protein 7.5  6.4 - 8.3 g/dL   Albumin 3.4 (*) 3.5 - 5.0 g/dL   Calcium 9.1  8.4 - 52.8 mg/dL    STUDIES: No results found.  ASSESSMENT: 42 [place].o. [place]:  1. Status post mastectomy on 10/31/2009 with sentinel node biopsy for a T1b N0, stage I, invasive ductal carcinoma of the left breast with lymphovascular invasion and high-grade DCIS, grade 2, ER 0%, PR 0%, KIi-67 83%, HER-2/neu amplified by CISH with a ratio of 4.71. The patient had 2/3 positive lymph nodes for isolated tumor cells.  2.  The patient underwent genetic testing in 08/2009 and was negative for the BRCA1 and BRCA2 gene mutation.  3. The patient underwent chemotherapy with Taxotere/Carboplatin/Herceptin from 01/03/2010 through 03/06/2010 x 6 cycles. Herceptin therapy was continued until 01/08/2011. Her last echocardiogram was on 03/13/2010 which showed an ejection fraction of 60-65%.  4. The patient underwent radiation therapy from 07/03/2010 through 08/17/2010.  5. Left chest postoperative seroma and left mastectomy bed with palpable fluctuance found by 05/05/2013 mammogram and [place].  6. Incomplete bowel emptying.  7. Postoperative/intermittent vaginal bleeding status post recent abortion.   PLAN: 1. The patient had a unilateral right digital diagnostic mammogram on 05/05/2012.  She will be due for another mammogram in 05/2013. The current mammogram did not show any evidence of malignancy.   2. Dr. Darnelle Catalan explained at length to the patient that her breast seroma with fluctuance is benign. He'll let her know that if it begins to bother her she should contact Dr. Luisa Hart to have the fluid drained. He also explained that the fluid may reaccumulate if drained. The patient decided to watch the area for now, and if it begins to bother her she will contact Dr. Luisa Hart.  3. The patient was given strategies to help her empty her bowels more completely,  including increased  hydration, increase fiber intake, and for her to consider taking probiotics. The patient was also asked to discuss this finding with her gastroenterologist, Dr. Jacqualine Mau.  4. The patient was encouraged to find a GYN physician to examine her regarding the postoperative/intermittent vaginal bleeding following her recent abortion. The patient is uninsured at this time and will meet with our financial counselors in order to assist her with obtaining a primary care physician and an OB/GYN for her continued care with limited financial resources.  5. We plan to see the patient again in one year (05/2013) at which time we will check a CBC, CMP and vitamin D level. The patient had a TSH and lipid panel drawn today which are still pending at this time. A digital diagnostic unilateral right mammogram and right breast ultrasound have been ordered for the patient for 05/2013.  All questions were answered.  The patient was encouraged to contact us in the interim with any problems, questions or concerns.   Larina Bras, NP-C 06/16/2012, 4:15 PM

## 2012-06-16 NOTE — Patient Instructions (Addendum)
Please contact us at [phone] if you have any questions or concerns. 

## 2012-06-16 NOTE — Telephone Encounter (Signed)
Made appt for pt to see Dr. Darnelle Catalan 05/2013. Emailed [facility] for the correct order to schedule mammogram.  Pt is aware that I will call her with the appt d/t for mammogram, the information for [facility] and PCP.pt will see financial counselor today along with an lab add-on. Also waiting for [facility] to inform me w/ an PCP provider.

## 2012-06-17 ENCOUNTER — Ambulatory Visit: Payer: Self-pay | Admitting: Oncology

## 2012-06-17 ENCOUNTER — Other Ambulatory Visit: Payer: Self-pay | Admitting: Lab

## 2012-06-17 ENCOUNTER — Other Ambulatory Visit (INDEPENDENT_AMBULATORY_CARE_PROVIDER_SITE_OTHER): Payer: Self-pay | Admitting: Surgery

## 2012-06-26 ENCOUNTER — Telehealth: Payer: Self-pay | Admitting: Family

## 2012-06-26 ENCOUNTER — Other Ambulatory Visit: Payer: Self-pay | Admitting: Family

## 2012-06-26 DIAGNOSIS — E781 Pure hyperglyceridemia: Secondary | ICD-10-CM

## 2012-06-26 MED ORDER — NIACIN ER 250 MG PO CPCR: 250.0000 mg | ORAL_CAPSULE | Freq: Every day | ORAL | Status: DC

## 2012-06-26 NOTE — Telephone Encounter (Signed)
With the assistance of Mandarin Chinese interpreter Marylyn Ishihara [phone] - Left the patient a message about elevated triglycerides and that we will start her on Niacin 250 mg PO QHS  (#30 with 12 refills).  An electronic prescription was sent to her pharmacy in [place], [place].  Explained that flushing is a normal side effect of this medication.

## 2012-09-07 ENCOUNTER — Ambulatory Visit (INDEPENDENT_AMBULATORY_CARE_PROVIDER_SITE_OTHER): Payer: Self-pay | Admitting: Surgery

## 2012-09-07 ENCOUNTER — Encounter (INDEPENDENT_AMBULATORY_CARE_PROVIDER_SITE_OTHER): Payer: Self-pay | Admitting: Surgery

## 2012-09-07 VITALS — BP 124/72 | HR 70 | Temp 98.2°F | Resp 18 | Ht 65.0 in | Wt 129.0 lb

## 2012-09-07 DIAGNOSIS — Z853 Personal history of malignant neoplasm of breast: Secondary | ICD-10-CM

## 2012-09-07 NOTE — Patient Instructions (Signed)
Call when you decide on surgery.

## 2012-09-07 NOTE — Progress Notes (Signed)
Subjective:     Patient ID: Lauren Cohen, female   DOB: Feb 14, 1971, 42 y.o.   MRN: [id number]  HPI A 42 year old Lauren Cohen, Lauren Cohen, woman of [place] descent with:  1. 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 She completed 6 cycles of q.3 week Taxotere, carboplatin and Herceptin with weekly Herceptin. She then completed radiation therapy on 08/17/2010 with weekly Herceptin given through the duration. She completed her maintenance q.3 week doses of Herceptin on 10/12.  2. History of duodenal ulcer, prior Carafate use, on Protonix 40 mg per day.  3. History of hepatitis B on Viread followed by Dr. Alessandra Grout. She has a history of silicone injection under her pectoralis muscles in [place] in the past.  Pt has noticed an area over her sternum that is soft and movable.  Not very tender.  Not hard.  Feels like silicone.patient returns to discuss options of aspiration versus removal of this fluid collection just medial to her left mastectomy incision.    Review of Systems  Constitutional: Negative.   HENT: Negative.   Cardiovascular: Negative.        Objective:   Physical Exam  Constitutional: She is oriented to person, place, and time. She appears well-developed and well-nourished.  HENT:  Head: Normocephalic and atraumatic.  Eyes: EOM are normal. Pupils are equal, round, and reactive to light.  Neck: Normal range of motion.  Pulmonary/Chest:    Neurological: She is alert and oriented to person, place, and time.  Skin: Skin is warm and dry.  Psychiatric: She has a normal mood and affect. Her behavior is normal. Judgment and thought content normal.  The area was prepped with antiseptic. This is located over her sternum. 18-gauge  needle was advanced and a very small amount of thick tenacious yellow fluid was removed consistent with silicone. Patient tolerated well.      Assessment:     A 42 year old Lauren Cohen, Lauren Cohen, woman of [place] descent with:  4. 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 She completed 6 cycles of q.3 week Taxotere, carboplatin and Herceptin with weekly Herceptin. She then completed radiation therapy on 08/17/2010 with weekly Herceptin given through the duration. She completed her maintenance q.3 week doses of Herceptin on 10/12.  5. History of duodenal ulcer, prior Carafate use, on Protonix 40 mg per day.  6. History of hepatitis B on Viread followed by Dr. Alessandra Grout.       Plan:     Mass over sternum old silicone.  Aspirated about 1/2 cc thick material.discussed options of open drainage of this area of silicone just medial to her mastectomy incision versus having her see a plastic surgeon to consider reconstruction. She will think it over and let us know. This material is not something that can be aspirated easily.

## 2012-09-10 ENCOUNTER — Telehealth (INDEPENDENT_AMBULATORY_CARE_PROVIDER_SITE_OTHER): Payer: Self-pay | Admitting: *Deleted

## 2012-09-10 NOTE — Telephone Encounter (Signed)
Lauren Cohen called as a Nurse, learning disability for the patient to state patient wants to be referred to the Engineer, petroleum.  Patient asking to see the same one Dr. Luisa Hart sent her to previously.  Please call Lauren Cohen [phone] for communication.

## 2012-09-11 ENCOUNTER — Telehealth (INDEPENDENT_AMBULATORY_CARE_PROVIDER_SITE_OTHER): Payer: Self-pay | Admitting: General Surgery

## 2012-09-11 ENCOUNTER — Other Ambulatory Visit (INDEPENDENT_AMBULATORY_CARE_PROVIDER_SITE_OTHER): Payer: Self-pay

## 2012-09-11 DIAGNOSIS — Z853 Personal history of malignant neoplasm of breast: Secondary | ICD-10-CM

## 2012-09-11 NOTE — Telephone Encounter (Signed)
Referral to Dr Kelly Splinter given to [place].

## 2012-09-11 NOTE — Telephone Encounter (Signed)
Patient referred to Dr Kelly Splinter to discuss breast reconstruction. Appt made 09/22/2012 @ 4:00 pm. Patient made aware. Records faxed to [phone]. Confirmation received.

## 2013-01-21 ENCOUNTER — Encounter: Payer: Self-pay | Admitting: Oncology

## 2013-01-21 NOTE — Progress Notes (Signed)
Received financial assistance application via fax from pt.  The application she faxed is the old one and we are no longer using it.  I left msg for pt informing her of this and requested she return my call to discuss the new application and policy.

## 2013-01-26 ENCOUNTER — Encounter: Payer: Self-pay | Admitting: Oncology

## 2013-01-26 NOTE — Progress Notes (Signed)
Pt returned my phone call so I explained the new financial assistance policy to her.  I will mail her a new application today.

## 2013-02-15 ENCOUNTER — Encounter (INDEPENDENT_AMBULATORY_CARE_PROVIDER_SITE_OTHER): Payer: Self-pay | Admitting: Surgery

## 2013-03-05 ENCOUNTER — Encounter (INDEPENDENT_AMBULATORY_CARE_PROVIDER_SITE_OTHER): Payer: Self-pay | Admitting: Surgery

## 2013-03-09 ENCOUNTER — Ambulatory Visit (INDEPENDENT_AMBULATORY_CARE_PROVIDER_SITE_OTHER): Payer: Self-pay | Admitting: Surgery

## 2013-06-17 ENCOUNTER — Other Ambulatory Visit: Payer: Self-pay | Admitting: *Deleted

## 2013-06-17 DIAGNOSIS — Z853 Personal history of malignant neoplasm of breast: Secondary | ICD-10-CM

## 2013-06-21 ENCOUNTER — Other Ambulatory Visit: Payer: Self-pay

## 2013-06-21 ENCOUNTER — Telehealth: Payer: Self-pay | Admitting: Oncology

## 2013-06-21 ENCOUNTER — Ambulatory Visit: Payer: Self-pay

## 2013-06-21 NOTE — Telephone Encounter (Signed)
pt called and needed to r/s appt to diff d.t on tuesday...done....pt aware of new d.t

## 2013-07-06 ENCOUNTER — Other Ambulatory Visit: Payer: Self-pay

## 2013-07-06 DIAGNOSIS — Z9012 Acquired absence of left breast and nipple: Secondary | ICD-10-CM

## 2013-07-06 DIAGNOSIS — Z1231 Encounter for screening mammogram for malignant neoplasm of breast: Secondary | ICD-10-CM

## 2013-07-20 ENCOUNTER — Encounter (INDEPENDENT_AMBULATORY_CARE_PROVIDER_SITE_OTHER): Payer: Self-pay

## 2013-07-20 ENCOUNTER — Ambulatory Visit
Admission: RE | Admit: 2013-07-20 | Discharge: 2013-07-20 | Disposition: A | Discharge status: Home / Self Care | Payer: Self-pay | Source: Ambulatory Visit

## 2013-07-20 DIAGNOSIS — Z1231 Encounter for screening mammogram for malignant neoplasm of breast: Secondary | ICD-10-CM

## 2013-07-20 DIAGNOSIS — Z9012 Acquired absence of left breast and nipple: Secondary | ICD-10-CM

## 2013-08-10 ENCOUNTER — Other Ambulatory Visit: Payer: Self-pay | Admitting: *Deleted

## 2013-08-10 ENCOUNTER — Ambulatory Visit (HOSPITAL_BASED_OUTPATIENT_CLINIC_OR_DEPARTMENT_OTHER): Payer: No Typology Code available for payment source | Admitting: Oncology

## 2013-08-10 ENCOUNTER — Telehealth: Payer: Self-pay | Admitting: Oncology

## 2013-08-10 ENCOUNTER — Other Ambulatory Visit (HOSPITAL_BASED_OUTPATIENT_CLINIC_OR_DEPARTMENT_OTHER): Payer: Self-pay

## 2013-08-10 VITALS — BP 95/55 | HR 63 | Temp 98.0°F | Resp 17 | Ht 65.0 in | Wt 130.3 lb

## 2013-08-10 DIAGNOSIS — Z853 Personal history of malignant neoplasm of breast: Secondary | ICD-10-CM

## 2013-08-10 DIAGNOSIS — R5381 Other malaise: Secondary | ICD-10-CM

## 2013-08-10 DIAGNOSIS — C50912 Malignant neoplasm of unspecified site of left female breast: Secondary | ICD-10-CM | POA: Insufficient documentation

## 2013-08-10 DIAGNOSIS — R5383 Other fatigue: Secondary | ICD-10-CM

## 2013-08-10 LAB — COMPREHENSIVE METABOLIC PANEL (CC13)
ALT: 19 U/L (ref 0–55)
ANION GAP: 10 meq/L (ref 3–11)
AST: 23 U/L (ref 5–34)
Albumin: 3.6 g/dL (ref 3.5–5.0)
Alkaline Phosphatase: 74 U/L (ref 40–150)
BILIRUBIN TOTAL: 0.67 mg/dL (ref 0.20–1.20)
BUN: 10.3 mg/dL (ref 7.0–26.0)
CHLORIDE: 106 meq/L (ref 98–109)
CO2: 26 meq/L (ref 22–29)
Calcium: 9.2 mg/dL (ref 8.4–10.4)
Creatinine: 0.8 mg/dL (ref 0.6–1.1)
Glucose: 81 mg/dl (ref 70–140)
Potassium: 3.7 mEq/L (ref 3.5–5.1)
SODIUM: 141 meq/L (ref 136–145)
TOTAL PROTEIN: 7.1 g/dL (ref 6.4–8.3)

## 2013-08-10 LAB — CBC WITH DIFFERENTIAL/PLATELET
BASO%: 0.9 % (ref 0.0–2.0)
Basophils Absolute: 0.1 10*3/uL (ref 0.0–0.1)
EOS%: 0.8 % (ref 0.0–7.0)
Eosinophils Absolute: 0 10*3/uL (ref 0.0–0.5)
HCT: 34.6 % — ABNORMAL LOW (ref 34.8–46.6)
HGB: 11.1 g/dL — ABNORMAL LOW (ref 11.6–15.9)
LYMPH#: 1.5 10*3/uL (ref 0.9–3.3)
LYMPH%: 25.2 % (ref 14.0–49.7)
MCH: 27.1 pg (ref 25.1–34.0)
MCHC: 32.1 g/dL (ref 31.5–36.0)
MCV: 84.6 fL (ref 79.5–101.0)
MONO#: 0.4 10*3/uL (ref 0.1–0.9)
MONO%: 6.7 % (ref 0.0–14.0)
NEUT#: 3.9 10*3/uL (ref 1.5–6.5)
NEUT%: 66.4 % (ref 38.4–76.8)
Platelets: 218 10*3/uL (ref 145–400)
RBC: 4.09 10*6/uL (ref 3.70–5.45)
RDW: 14.7 % — ABNORMAL HIGH (ref 11.2–14.5)
WBC: 5.9 10*3/uL (ref 3.9–10.3)

## 2013-08-10 NOTE — Progress Notes (Signed)
[place]  [phone] Fax:(336) [phone]  OFFICE PROGRESS NOTE   ID: Lauren Cohen   DOB: [birth year]  MR#: [id number]  YOM#:[id number]   SU:  Lauren A. Cornett, MD OTHER MD: Lauren Patrick, MD   HISTORY OF PRESENT ILLNESS: From Dr. Collier Salina Cohen's new patient evaluation dated 09/13/2009:  "This is a pleasant 43 year old woman of Chinese extraction here today with an interpreter for evaluation and discussion of recent diagnosis of breast cancer. 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  She apparently has had a problem with a nipple discharge for a period of a year.  She had imaging studies at [facility] in [place] and subsequently had an FNA done at [facility].  Per history, she has been having nipple discharge for intermittently over a year.  She had implants placed as well.  Prior to that, the FNA reports suggested the presence of normal calcifications in ducts at the 11 o'clock position of the left breast 2.0 cm from the nipple.  She appeared to also have an abnormality adjacent to that.  On the 4th of April, she had an FNA of the abnormal area at 98 o' clock, 2.0 cm from the nipple, one smear was diagnostic and showed atypical cells consistent with a diagnosis of adenocarcinoma. There was an additional aspirate, which showed fibrocystic changes.  The patient returned and wished to have her further workup in [place].  She subsequently had another mammogram pre-MRI done on 08/08/2009, which showed subpectoral implants.  This plate showed heterogeneous dense breast tissue and no dominant masses were seen.  The MRI scan from 08/08/2009 showed that in the upper inner subarea of the left breast at 9:30 is a 1.2 x 1.1 x 0.6 cm ill-defined enhancing mass seen, which appeared to correspond to the  calcification seen in the mammograms from [place], posterior and medially and upper inner left breast at 10 o'clock position a 1.2 x 1.2 x 0.6 cm enhancing mass was seen.  It was not clear whether this represented an additional area that was previously described at the [place] mammograms, as we do not have the copies of those reports.  Of note is that the patient had suspicious microcalcifications of the left nipple seen as well.  There was injected material seen in the subpectoral areas of both breasts.  The patient has been initially seen by Dr. Barry Cohen and now Dr. Brantley Cohen and the plan is to have her undergo a mastectomy of the left breast with reconstruction and insertion of new implants." Her subsequent history is as detailed below.  INTERVAL HISTORY: Lauren Cohen returns today for followup of her breast cancer accompanied by her  interpreter Lauren Cohen.  The interval history is unremarkable. 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 She says family is "fine".  REVIEW OF SYSTEMS: Sometimes she feels a little weak, particularly when she stands from a squatting position. There has been no fainting, falls, dizziness, nausea, vomiting, or loss of consciousness. There has been no seizure activity. She has not noted any visual changes. A detailed review of systems today was otherwise entirely negative.Marland Kitchen  PAST MEDICAL HISTORY: Past Medical History  Diagnosis Date  . Hepatitis B infection   . Breast cancer 2011  . 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 Duodenal ulcer   . S/P  left mastectomy 2011  . Peptic ulcer disease   . Gastritis   . Anemia   . GI bleed      PAST SURGICAL HISTORY: Past Surgical History  Procedure Laterality Date  . Left modified mastectomy  2011  . Portacath placement    . Mastectomy    . Port-a-cath removal  02/04/2011    Procedure: REMOVAL PORT-A-CATH;  Surgeon: Lauren Faster. Cornett, MD;  Location: [place] OR;  Service: General;  Laterality: N/A;    FAMILY HISTORY Family History  Problem  Relation Age of Onset  . Hepatitis Mother   . Hepatitis B Mother      GYNECOLOGIC HISTORY: Gravida 4, para 2, Menarche age 65,  premenopausal.  SOCIAL HISTORY: Married for 39 years, two teenaged sons age 36 and 34.  Her and her husband work in [employer]. She enjoys working on the computer and watching TV in her spare time.   ADVANCED DIRECTIVES: Not on file  HEALTH MAINTENANCE: History  Substance Use Topics  . Smoking status: Never Smoker   . Smokeless tobacco: Never Used  . Alcohol Use: No    Colonoscopy: The patient has never had a colonoscopy PAP: Her last Pap smear was on 12/17/2010 Bone density: The patient has never had a bone density scan Lipid panel: A lipid panel was ordered today.  No Known Allergies  Current Outpatient Prescriptions  Medication Sig Dispense Refill  . niacin 250 MG CR capsule Take 1 capsule (250 mg total) by mouth at bedtime.  30 capsule  12  . tenofovir (VIREAD) 300 MG tablet Take 1 tablet (300 mg total) by mouth daily.  30 tablet  5   No current facility-administered medications for this visit.    OBJECTIVE: Young-appearing Asian woman who appears well  Filed Vitals:   08/10/13 1416  BP: 95/55  Pulse: 63  Temp: 98 F (36.7 C)  Resp: 17     Body mass index is 21.68 kg/(m^2).      ECOG FS: 0           Sclerae unicteric, pupils equal and reactive Oropharynx clear and moist No cervical or supraclavicular adenopathy Lungs no rales or rhonchi Heart regular rate and rhythm Abd soft, nontender, positive bowel sounds MSK no focal spinal tenderness, no upper extremity lymphedema; there is a fluctuant area over the lower sternum measuring approximately 4 x 4 centimeters. There is no erythema or tenderness. There is minimal scar tissue associated with this. It appears entirely benign and entirely stable as compared to last year. Neuro: nonfocal, well oriented, appropriate affect Breasts: The right breast is unremarkable. The left breast is  status post mastectomy. There is no evidence of local recurrence. The left axilla is benign.   LAB RESULTS: Results for orders placed in visit on 08/10/13 (from the past 168 hour(s))  CBC WITH DIFFERENTIAL   Collection Time    08/10/13  2:04 PM      Result Value Ref Range   WBC 5.9  3.9 - 10.3 10e3/uL   NEUT# 3.9  1.5 - 6.5 10e3/uL   HGB 11.1 (*) 11.6 - 15.9 g/dL   HCT 34.6 (*) 34.8 - 46.6 %   Platelets 218  145 - 400 10e3/uL   MCV 84.6  79.5 - 101.0 fL   MCH 27.1  25.1 - 34.0 pg   MCHC 32.1  31.5 - 36.0 g/dL   RBC 4.09  3.70 - 5.45 10e6/uL   RDW 14.7 (*) 11.2 - 14.5 %  lymph# 1.5  0.9 - 3.3 10e3/uL   MONO# 0.4  0.1 - 0.9 10e3/uL   Eosinophils Absolute 0.0  0.0 - 0.5 10e3/uL   Basophils Absolute 0.1  0.0 - 0.1 10e3/uL   NEUT% 66.4  38.4 - 76.8 %   LYMPH% 25.2  14.0 - 49.7 %   MONO% 6.7  0.0 - 14.0 %   EOS% 0.8  0.0 - 7.0 %   BASO% 0.9  0.0 - 2.0 %  COMPREHENSIVE METABOLIC PANEL (OA41)   Collection Time    08/10/13  2:04 PM      Result Value Ref Range   Sodium 141  136 - 145 mEq/L   Potassium 3.7  3.5 - 5.1 mEq/L   Chloride 106  98 - 109 mEq/L   CO2 26  22 - 29 mEq/L   Glucose 81  70 - 140 mg/dl   BUN 10.3  7.0 - 26.0 mg/dL   Creatinine 0.8  0.6 - 1.1 mg/dL   Total Bilirubin 0.67  0.20 - 1.20 mg/dL   Alkaline Phosphatase 74  40 - 150 U/L   AST 23  5 - 34 U/L   ALT 19  0 - 55 U/L   Total Protein 7.1  6.4 - 8.3 g/dL   Albumin 3.6  3.5 - 5.0 g/dL   Calcium 9.2  8.4 - 10.4 mg/dL   Anion Gap 10  3 - 11 mEq/L    STUDIES:   CLINICAL DATA: Screening. Malignant mastectomy of the left breast  in 2011 with chemotherapy and radiation therapy. Retropectoral  implant on the right.  EXAM:  DIGITAL SCREENING UNILATERAL RIGHT MAMMOGRAM WITH IMPLANTS, TOMO AND  CAD  COMPARISON: Previous exam(s).  ACR Breast Density Category c: The breast tissue is heterogeneously  dense, which may obscure small masses.  FINDINGS:  There are no findings suspicious for malignancy. Images were   processed with CAD.  IMPRESSION:  No mammographic evidence of malignancy. A result letter of this  screening mammogram will be mailed directly to the patient.  RECOMMENDATION:  Screening mammogram in one year. (Code:SM-B-01Y)  BI-RADS CATEGORY 1: Negative.  Electronically Signed  By: Willadean Carol M.D.  On: 07/21/2013 22:20      ASSESSMENT: 43 y.o. BRCA negative [place], [place] Mandarin speaker:  1. Status post mastectomy on 10/31/2009 with sentinel node biopsy for a T1b N0, Cohen IA, invasive ductal carcinoma of the left breast, grade 2, ER 0%, PR 0%, KIi-67 83%, HER-2/neu amplified by CISH with a ratio of 4.71. The patient had 2/3 positive lymph nodes for isolated tumor cells.  2.  S/p adjuvant Taxotere/Carboplatin/Herceptin from 01/03/2010 through 03/06/2010 x 6 cycles. Herceptin therapy was continued until 01/08/2011. Her last echocardiogram was on 03/13/2010 which showed an ejection fraction of 60-65%.  4. The patient underwent radiation therapy from 07/03/2010 through 08/17/2010.  5. Left chest postoperative seroma and left mastectomy bed with palpable fluctuance found by 05/05/2013   PLAN: Lauren.Partridge appears very healthy and there is certainly no evidence of disease recurrence. I do not have a simple explanation as to why she feels weak sometimes and she was not orthostatic to my exam today. I have encouraged her to continue to exercise regularly in addition to her very physical work and to drink lots of fluids.  She will see me again in one year. If she had a primary care physician and she could "graduate" at that time she tells me she has not been able to find a Mandarin speaking  physician in this area. In that case we would continue to follow her on a yearly basis.  I reassured her that the fluctuant area over her lower sternum is benign and unchanged from prior. I do not find evidence of recurrent disease and indeed her prognosis is very good. The patient has a good  understanding, I believe, of the overall plan. She agrees with it. She knows a goal of treatment in her case is cure. She will call with any problems that may develop before her next visit here.  Chauncey Cruel, MD  08/10/2013, 3:02 PM

## 2013-08-10 NOTE — Telephone Encounter (Signed)
, °

## 2013-08-10 NOTE — Addendum Note (Signed)
Addended by: Laureen Abrahams on: 08/10/2013 06:41 PM   Modules accepted: Orders

## 2014-01-31 ENCOUNTER — Encounter (INDEPENDENT_AMBULATORY_CARE_PROVIDER_SITE_OTHER): Payer: Self-pay | Admitting: Surgery

## 2014-07-15 ENCOUNTER — Other Ambulatory Visit: Payer: Self-pay

## 2014-07-15 DIAGNOSIS — Z1231 Encounter for screening mammogram for malignant neoplasm of breast: Secondary | ICD-10-CM

## 2014-07-26 ENCOUNTER — Other Ambulatory Visit: Payer: Self-pay

## 2014-07-26 ENCOUNTER — Ambulatory Visit
Admission: RE | Admit: 2014-07-26 | Discharge: 2014-07-26 | Disposition: A | Discharge status: Home / Self Care | Payer: No Typology Code available for payment source | Source: Ambulatory Visit

## 2014-07-26 DIAGNOSIS — Z1231 Encounter for screening mammogram for malignant neoplasm of breast: Secondary | ICD-10-CM

## 2014-08-09 ENCOUNTER — Other Ambulatory Visit: Payer: No Typology Code available for payment source

## 2014-08-16 ENCOUNTER — Ambulatory Visit: Payer: No Typology Code available for payment source | Admitting: Oncology

## 2014-08-16 ENCOUNTER — Other Ambulatory Visit: Payer: No Typology Code available for payment source

## 2014-09-13 ENCOUNTER — Telehealth: Payer: Self-pay | Admitting: Oncology

## 2014-09-13 NOTE — Telephone Encounter (Signed)
Returned Advertising account executive. Confirmed appointment schedule for July. Reschedule from missed appointment in May.

## 2014-10-17 ENCOUNTER — Encounter: Payer: Self-pay | Admitting: Genetic Counselor

## 2014-10-18 ENCOUNTER — Ambulatory Visit (HOSPITAL_COMMUNITY)
Admission: RE | Admit: 2014-10-18 | Discharge: 2014-10-18 | Disposition: A | Discharge status: Home / Self Care | Payer: No Typology Code available for payment source | Source: Ambulatory Visit | Attending: Oncology | Admitting: Oncology

## 2014-10-18 ENCOUNTER — Telehealth: Payer: Self-pay | Admitting: Oncology

## 2014-10-18 ENCOUNTER — Ambulatory Visit (HOSPITAL_BASED_OUTPATIENT_CLINIC_OR_DEPARTMENT_OTHER): Payer: Self-pay | Admitting: Oncology

## 2014-10-18 ENCOUNTER — Other Ambulatory Visit (HOSPITAL_BASED_OUTPATIENT_CLINIC_OR_DEPARTMENT_OTHER): Payer: Self-pay

## 2014-10-18 VITALS — BP 109/58 | HR 58 | Temp 98.1°F | Resp 18 | Ht 65.0 in | Wt 138.5 lb

## 2014-10-18 DIAGNOSIS — C50912 Malignant neoplasm of unspecified site of left female breast: Secondary | ICD-10-CM

## 2014-10-18 DIAGNOSIS — R079 Chest pain, unspecified: Secondary | ICD-10-CM | POA: Insufficient documentation

## 2014-10-18 DIAGNOSIS — Z853 Personal history of malignant neoplasm of breast: Secondary | ICD-10-CM

## 2014-10-18 DIAGNOSIS — R0789 Other chest pain: Secondary | ICD-10-CM

## 2014-10-18 LAB — LIPID PANEL
CHOL/HDL RATIO: 3.9 ratio
Cholesterol: 172 mg/dL (ref 0–200)
HDL: 44 mg/dL — AB (ref >=46)
LDL Cholesterol: 95 mg/dL (ref 0–99)
TRIGLYCERIDES: 166 mg/dL — AB (ref <150)
VLDL: 33 mg/dL (ref 0–40)

## 2014-10-18 LAB — COMPREHENSIVE METABOLIC PANEL (CC13)
ALBUMIN: 3.5 g/dL (ref 3.5–5.0)
ALT: 15 U/L (ref 0–55)
AST: 21 U/L (ref 5–34)
Alkaline Phosphatase: 65 U/L (ref 40–150)
Anion Gap: 6 mEq/L (ref 3–11)
BILIRUBIN TOTAL: 0.66 mg/dL (ref 0.20–1.20)
BUN: 8.9 mg/dL (ref 7.0–26.0)
CO2: 25 meq/L (ref 22–29)
Calcium: 8.9 mg/dL (ref 8.4–10.4)
Chloride: 110 mEq/L — ABNORMAL HIGH (ref 98–109)
Creatinine: 0.7 mg/dL (ref 0.6–1.1)
EGFR: >90 mL/min/{1.73_m2} (ref >90)
Glucose: 93 mg/dl (ref 70–140)
Potassium: 3.6 mEq/L (ref 3.5–5.1)
Sodium: 141 mEq/L (ref 136–145)
TOTAL PROTEIN: 6.9 g/dL (ref 6.4–8.3)

## 2014-10-18 LAB — CBC WITH DIFFERENTIAL/PLATELET
BASO%: 1 % (ref 0.0–2.0)
BASOS ABS: 0 10*3/uL (ref 0.0–0.1)
EOS%: 1.2 % (ref 0.0–7.0)
Eosinophils Absolute: 0.1 10*3/uL (ref 0.0–0.5)
HCT: 36.9 % (ref 34.8–46.6)
HGB: 12.5 g/dL (ref 11.6–15.9)
LYMPH#: 1.3 10*3/uL (ref 0.9–3.3)
LYMPH%: 27.1 % (ref 14.0–49.7)
MCH: 30 pg (ref 25.1–34.0)
MCHC: 33.7 g/dL (ref 31.5–36.0)
MCV: 89 fL (ref 79.5–101.0)
MONO#: 0.3 10*3/uL (ref 0.1–0.9)
MONO%: 6.1 % (ref 0.0–14.0)
NEUT#: 3 10*3/uL (ref 1.5–6.5)
NEUT%: 64.6 % (ref 38.4–76.8)
Platelets: 214 10*3/uL (ref 145–400)
RBC: 4.15 10*6/uL (ref 3.70–5.45)
RDW: 13.5 % (ref 11.2–14.5)
WBC: 4.7 10*3/uL (ref 3.9–10.3)

## 2014-10-18 NOTE — Progress Notes (Signed)
[place]  Telephone:([phone] Fax:(336) [phone]  OFFICE PROGRESS NOTE   ID: Lauren Cohen   DOB: [birth year]  MR#: [id number]  ATF#:[id number]   SU:  Lauren A. 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 Collier Salina Cohen's new patient evaluation dated 09/13/2009:  "This is a pleasant 44 year old woman of Chinese extraction here today with an interpreter for evaluation and discussion of recent diagnosis of breast cancer. 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  She apparently has had a problem with a nipple discharge for a period of a year.  She had imaging studies at [facility] in [place] and subsequently had an FNA done at [facility].  Per history, she has been having nipple discharge for intermittently over a year.  She had implants placed as well.  Prior to that, the FNA reports suggested the presence of normal calcifications in ducts at the 11 o'clock position of the left breast 2.0 cm from the nipple.  She appeared to also have an abnormality adjacent to that.  On the 4th of April, she had an FNA of the abnormal area at 43 o' clock, 2.0 cm from the nipple, one smear was diagnostic and showed atypical cells consistent with a diagnosis of adenocarcinoma. There was an additional aspirate, which showed fibrocystic changes.  The patient returned and wished to have her further workup in [place].  She subsequently had another mammogram pre-MRI done on 08/08/2009, which showed subpectoral implants.  This plate showed heterogeneous dense breast tissue and no dominant masses were seen.  The MRI scan from 08/08/2009 showed that in the upper inner subarea of the left breast at 9:30 is a 1.2 x 1.1 x  0.6 cm ill-defined enhancing mass seen, which appeared to correspond to the calcification seen in the mammograms from [place], posterior and medially and upper inner left breast at 10 o'clock position a 1.2 x 1.2 x 0.6 cm enhancing mass was seen.  It was not clear whether this represented an additional area that was previously described at the [place] mammograms, as we do not have the copies of those reports.  Of note is that the patient had suspicious microcalcifications of the left nipple seen as well.  There was injected material seen in the subpectoral areas of both breasts.  The patient has been initially seen by Dr. Barry Cohen and now Dr. Brantley Cohen and the plan is to have her undergo a mastectomy of the left breast with reconstruction and insertion of new implants." Her subsequent history is as detailed below.  INTERVAL HISTORY: Lauren Cohen returns today for followup of her breast cancer accompanied by an interpreter. The patient is doing "okay". She still works long hours in [employer]. Family is doing "fine". She exercises mostly by walking.  REVIEW OF SYSTEMS: She tells me she cries easily when she sees something sad, but then she feels better afterwards and just goes right on as before. For the last 7-10 days she has had a pain in the left scapular area, worse with deep breathing or coughing. She denies shortness of breath, phlegm production, or fever. A detailed review of systems today was otherwise stable.  PAST MEDICAL HISTORY: Past Medical History  Diagnosis Date  .  Hepatitis B infection   . Breast cancer 2011  . 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 Duodenal ulcer   . S/P left mastectomy 2011  . Peptic ulcer disease   . Gastritis   . Anemia   . GI bleed      PAST SURGICAL HISTORY: Past Surgical History  Procedure Laterality Date  . Left modified mastectomy  2011  . Portacath placement    . Mastectomy    . Port-a-cath removal  02/04/2011    Procedure: REMOVAL PORT-A-CATH;  Surgeon: Lauren Faster. Cornett, MD;  Location: [place] OR;  Service: General;  Laterality: N/A;    FAMILY HISTORY Family History  Problem Relation Age of Onset  . Hepatitis Mother   . Hepatitis B Mother      GYNECOLOGIC HISTORY: Gravida 4, para 2, Menarche age 57,  Premenopausal. Most recent period was 10/03/2014. The one before that was early June.  SOCIAL HISTORY: Married for 7 years, two teenaged sons age 40 and 91.  Her and her husband work in [employer]. She enjoys working on the computer and watching TV in her spare time.   ADVANCED DIRECTIVES: Not on file  HEALTH MAINTENANCE: History  Substance Use Topics  . Smoking status: Never Smoker   . Smokeless tobacco: Never Used  . Alcohol Use: No    Colonoscopy: The patient has never had a colonoscopy PAP: Her last Pap smear was on 12/17/2010 Bone density: The patient has never had a bone density scan Lipid panel: A lipid panel was ordered today.  No Known Allergies  No current outpatient prescriptions on file.   No current facility-administered medications for this visit.    OBJECTIVE: Young-appearing Asian woman in no acute distress Filed Vitals:   10/18/14 0950  BP: 109/58  Pulse: 58  Temp: 98.1 F (36.7 C)  Resp: 18     Body mass index is 23.05 kg/(m^2).      ECOG FS: 0           Sclerae unicteric, pupils round and equal Oropharynx clear and moist-- no thrush or other lesions No cervical or supraclavicular adenopathy Lungs no rales or rhonchi; there is a catch or crampy pain in the upper left back with deep breathing or coughing, but again no wheezing or rubs noted by auscultation Heart regular rate and rhythm Abd soft, nontender, positive bowel sounds MSK no focal spinal tenderness, no upper extremity lymphedema; is no tenderness to palpation over the left scapular area Neuro: nonfocal, well oriented, positive affect Breasts: The right breast is unremarkable. The left breast is status post mastectomy and radiation. There is  no evidence of local recurrence.    LAB RESULTS: No results found for this or any previous visit (from the past 168 hour(s)).  STUDIES:      CLINICAL DATA: Screening.  EXAM: DIGITAL SCREENING UNILATERAL RIGHT MAMMOGRAM WITH IMPLANTS AND CAD  The patient has a retropectoral implant. Standard and implant displaced views were performed.  COMPARISON: Previous exam(s).  ACR Breast Density Category c: The breast tissue is heterogeneously dense, which may obscure small masses.  FINDINGS: The patient has had a left mastectomy. There are no findings suspicious for malignancy. Images were processed with CAD.  IMPRESSION: No mammographic evidence of malignancy. A result letter of this screening mammogram will be mailed directly to the patient.  RECOMMENDATION: Screening mammogram in one year. (Code:SM-B-01Y)  BI-RADS CATEGORY 1: Negative.   Electronically Signed  By: Margarette Canada M.D.  On: 07/27/2014 16:22   ASSESSMENT:  44 y.o. BRCA negative [place], [place] Mandarin speaker:  1. Status post left mastectomy on 10/31/2009 with sentinel node biopsy for a T1b N0, Cohen IA, invasive ductal carcinoma of the left breast, grade 2, ER 0%, PR 0%, KIi-67 83%, HER-2/neu amplified by CISH with a ratio of 4.71. The patient had 2/3 positive lymph nodes for isolated tumor cells.  2.  S/p adjuvant Taxotere/Carboplatin/Herceptin from 01/03/2010 through 03/06/2010 x 6 cycles. Herceptin therapy was continued until 01/08/2011. Her last echocardiogram was on 03/13/2010 which showed an ejection fraction of 60-65%.  4. The patient underwent radiation therapy from 07/03/2010 through 08/17/2010.  5. Left chest postoperative seroma and left mastectomy bed with palpable fluctuance found by 05/05/2013   6. Patient has been offered genetics testing   PLAN: Lauren Cohen seems to be doing very well 5 years out from her definitive surgery. If she had a primary care physician I would be  comfortable releasing her to his or her care. However she has not been able to find a Mandarin speaking physician.  I then discussed my new partner Dr. Burr Medico , who does speak mender in and who is an excellent breast cancer physician. However Lauren Cohen is very comfortable coming here once a year and she would like to see me again in the next few years. Of course we will need a an interpreter.  I am not sure what the discomfort that Lauren Cohen is feeling in the left scapular area may be. She tells me it is getting better. I'm going to get a chest x-ray today. If the problem persists into next week however she will call me and we will get a chest CT at that time.  At the next visit I will encourage her to undergo genetics testing.  Lauren Cruel, MD  10/18/2014, 9:53 AM

## 2014-10-18 NOTE — Telephone Encounter (Signed)
Gave avs & calendar for July 2017. Sent patient for Chest Xray.

## 2014-10-21 LAB — ESTRADIOL, ULTRA SENS: ESTRADIOL, ULTRA SENSITIVE: 93 pg/mL

## 2014-10-24 ENCOUNTER — Other Ambulatory Visit: Payer: Self-pay | Admitting: Oncology

## 2015-07-17 ENCOUNTER — Other Ambulatory Visit: Payer: Self-pay

## 2015-07-17 DIAGNOSIS — Z1231 Encounter for screening mammogram for malignant neoplasm of breast: Secondary | ICD-10-CM

## 2015-08-08 ENCOUNTER — Ambulatory Visit
Admission: RE | Admit: 2015-08-08 | Discharge: 2015-08-08 | Disposition: A | Discharge status: Home / Self Care | Payer: No Typology Code available for payment source | Source: Ambulatory Visit

## 2015-08-08 DIAGNOSIS — Z1231 Encounter for screening mammogram for malignant neoplasm of breast: Secondary | ICD-10-CM

## 2015-10-16 ENCOUNTER — Other Ambulatory Visit: Payer: Self-pay | Admitting: *Deleted

## 2015-10-16 DIAGNOSIS — C50912 Malignant neoplasm of unspecified site of left female breast: Secondary | ICD-10-CM

## 2015-10-17 ENCOUNTER — Other Ambulatory Visit (HOSPITAL_BASED_OUTPATIENT_CLINIC_OR_DEPARTMENT_OTHER): Payer: Self-pay

## 2015-10-17 ENCOUNTER — Ambulatory Visit (HOSPITAL_BASED_OUTPATIENT_CLINIC_OR_DEPARTMENT_OTHER): Payer: Self-pay | Admitting: Oncology

## 2015-10-17 VITALS — BP 103/50 | HR 64 | Temp 98.3°F | Resp 18 | Ht 65.0 in | Wt 135.8 lb

## 2015-10-17 DIAGNOSIS — C50912 Malignant neoplasm of unspecified site of left female breast: Secondary | ICD-10-CM

## 2015-10-17 DIAGNOSIS — Z853 Personal history of malignant neoplasm of breast: Secondary | ICD-10-CM

## 2015-10-17 LAB — CBC WITH DIFFERENTIAL/PLATELET
BASO%: 0.3 % (ref 0.0–2.0)
BASOS ABS: 0 10*3/uL (ref 0.0–0.1)
EOS ABS: 0.1 10*3/uL (ref 0.0–0.5)
EOS%: 0.9 % (ref 0.0–7.0)
HEMATOCRIT: 36 % (ref 34.8–46.6)
HEMOGLOBIN: 11.9 g/dL (ref 11.6–15.9)
LYMPH#: 1.7 10*3/uL (ref 0.9–3.3)
LYMPH%: 29.7 % (ref 14.0–49.7)
MCH: 27.9 pg (ref 25.1–34.0)
MCHC: 33.1 g/dL (ref 31.5–36.0)
MCV: 84.5 fL (ref 79.5–101.0)
MONO#: 0.5 10*3/uL (ref 0.1–0.9)
MONO%: 8.4 % (ref 0.0–14.0)
NEUT#: 3.5 10*3/uL (ref 1.5–6.5)
NEUT%: 60.7 % (ref 38.4–76.8)
PLATELETS: 187 10*3/uL (ref 145–400)
RBC: 4.26 10*6/uL (ref 3.70–5.45)
RDW: 13.7 % (ref 11.2–14.5)
WBC: 5.7 10*3/uL (ref 3.9–10.3)

## 2015-10-17 LAB — COMPREHENSIVE METABOLIC PANEL
ALT: 17 U/L (ref 0–55)
AST: 20 U/L (ref 5–34)
Albumin: 3.6 g/dL (ref 3.5–5.0)
Alkaline Phosphatase: 69 U/L (ref 40–150)
Anion Gap: 7 mEq/L (ref 3–11)
BUN: 11 mg/dL (ref 7.0–26.0)
CHLORIDE: 107 meq/L (ref 98–109)
CO2: 25 meq/L (ref 22–29)
CREATININE: 0.8 mg/dL (ref 0.6–1.1)
Calcium: 9 mg/dL (ref 8.4–10.4)
EGFR: 89 mL/min/{1.73_m2} — ABNORMAL LOW (ref >90)
Glucose: 104 mg/dl (ref 70–140)
POTASSIUM: 4.2 meq/L (ref 3.5–5.1)
Sodium: 139 mEq/L (ref 136–145)
Total Bilirubin: 0.79 mg/dL (ref 0.20–1.20)
Total Protein: 7.4 g/dL (ref 6.4–8.3)

## 2015-10-17 NOTE — Progress Notes (Signed)
[place]  Telephone:([phone] Fax:(336) [phone]  OFFICE PROGRESS NOTE   ID: Lauren Cohen   DOB: 07/19/1970  MR#: [id number]  [id number]#:[id number]   SU:  Thomas A. Cornett, MD OTHER MD: Ileene Patrick, MD  CHIEF COMPLAINT: Triple negative breast cancer  CURRENT TREATMENT: Observation   HISTORY OF PRESENT ILLNESS: From Dr. [name]'s new patient evaluation dated 09/13/2009:  "This is a pleasant 45 year old woman of Chinese extraction here today with an interpreter for evaluation and discussion of recent diagnosis of breast cancer. 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  She apparently has had a problem with a nipple discharge for a period of a year.  She had imaging studies at [facility] in [place] and subsequently had an FNA done at [facility].  Per history, she has been having nipple discharge for intermittently over a year.  She had implants placed as well.  Prior to that, the FNA reports suggested the presence of normal calcifications in ducts at the 11 o'clock position of the left breast 2.0 cm from the nipple.  She appeared to also have an abnormality adjacent to that.  On the 4th of April, she had an FNA of the abnormal area at 58 o' clock, 2.0 cm from the nipple, one smear was diagnostic and showed atypical cells consistent with a diagnosis of adenocarcinoma. There was an additional aspirate, which showed fibrocystic changes.  The patient returned and wished to have her further workup in [place].  She subsequently had another mammogram pre-MRI done on 08/08/2009, which showed subpectoral implants.  This plate showed heterogeneous dense breast tissue and no dominant masses were seen.  The MRI scan from 08/08/2009 showed that in the upper inner subarea of the left breast at 9:30 is a 1.2 x 1.1 x  0.6 cm ill-defined enhancing mass seen, which appeared to correspond to the calcification seen in the mammograms from [place], posterior and medially and upper inner left breast at 10 o'clock position a 1.2 x 1.2 x 0.6 cm enhancing mass was seen.  It was not clear whether this represented an additional area that was previously described at the [place] mammograms, as we do not have the copies of those reports.  Of note is that the patient had suspicious microcalcifications of the left nipple seen as well.  There was injected material seen in the subpectoral areas of both breasts.  The patient has been initially seen by Dr. Barry Dienes and now Dr. Brantley Stage and the plan is to have her undergo a mastectomy of the left breast with reconstruction and insertion of new implants." Her subsequent history is as detailed below.  INTERVAL HISTORY: Lauren Cohen returns today for followup of her triple negative breast cancer. Unfortunately no interpreter was available today, but her English is getting better and we were able to make ourselves understood.   REVIEW OF SYSTEMS: She continues to have discomfort in the upper left anterior chest, above where she had the surgery. She had this last year and we obtained a chest x-ray at that time, which was unremarkable. The pain comes and goes, it is not there all the time. It is not very severe. It is just that it works for her. Aside from this a detailed review of systems today so far as I was able to obtain one  was negative  PAST MEDICAL HISTORY: Past Medical History  Diagnosis Date  . Hepatitis B infection   . Breast cancer 2011  . 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 Duodenal ulcer   . S/P left mastectomy 2011  . Peptic ulcer disease   . Gastritis   . Anemia   . GI bleed      PAST SURGICAL HISTORY: Past Surgical History  Procedure Laterality Date  . Left modified mastectomy  2011  . Portacath placement    . Mastectomy    . Port-a-cath removal  02/04/2011    Procedure: REMOVAL  PORT-A-CATH;  Surgeon: Joyice Faster. Cornett, MD;  Location: [facility];  Service: General;  Laterality: N/A;    FAMILY HISTORY Family History  Problem Relation Age of Onset  . Hepatitis Mother   . Hepatitis B Mother      GYNECOLOGIC HISTORY: Gravida 4, para 2, Menarche age 76,  Premenopausal. Most recent period was 10/03/2014. The one before that was early June.  SOCIAL HISTORY: Married for 20 years, two teenaged sons age 7 and 20.  Her and her husband work in [employer]. She enjoys working on the computer and watching TV in her spare time.   ADVANCED DIRECTIVES: Not on file  HEALTH MAINTENANCE: Social History  Substance Use Topics  . Smoking status: Never Smoker   . Smokeless tobacco: Never Used  . Alcohol Use: No    Colonoscopy: The patient has never had a colonoscopy PAP: Her last Pap smear was on 12/17/2010 Bone density: The patient has never had a bone density scan Lipid panel: A lipid panel was ordered today.  No Known Allergies  No current outpatient prescriptions on file.   No current facility-administered medications for this visit.    OBJECTIVE: Young-appearing Asian woman Who appears well  Filed Vitals:   10/17/15 1159  BP: 103/50  Pulse: 64  Temp: 98.3 F (36.8 C)  Resp: 18     Body mass index is 22.6 kg/(m^2).      ECOG FS: 1           Sclerae unicteric, EOMs intact Oropharynx clear and moist No cervical or supraclavicular adenopathy Lungs no rales or rhonchi Heart regular rate and rhythm Abd soft, nontender, positive bowel sounds MSK no focal spinal tenderness, no upper extremity lymphedema Neuro: nonfocal, well oriented, appropriate affect Breasts: The right breast is unremarkable. The left breast is status post mastectomy. There is no evidence of chest wall recurrence. The left axilla is benign.   LAB RESULTS: Results for orders placed or performed in visit on 10/17/15 (from the past 168 hour(s))  CBC with Differential   Collection Time:  10/17/15 11:20 AM  Result Value Ref Range   WBC 5.7 3.9 - 10.3 10e3/uL   NEUT# 3.5 1.5 - 6.5 10e3/uL   HGB 11.9 11.6 - 15.9 g/dL   HCT 36.0 34.8 - 46.6 %   Platelets 187 145 - 400 10e3/uL   MCV 84.5 79.5 - 101.0 fL   MCH 27.9 25.1 - 34.0 pg   MCHC 33.1 31.5 - 36.0 g/dL   RBC 4.26 3.70 - 5.45 10e6/uL   RDW 13.7 11.2 - 14.5 %   lymph# 1.7 0.9 - 3.3 10e3/uL   MONO# 0.5 0.1 - 0.9 10e3/uL   Eosinophils Absolute 0.1 0.0 - 0.5 10e3/uL   Basophils Absolute 0.0 0.0 - 0.1 10e3/uL   NEUT% 60.7 38.4 - 76.8 %   LYMPH% 29.7 14.0 - 49.7 %   MONO% 8.4 0.0 -  14.0 %   EOS% 0.9 0.0 - 7.0 %   BASO% 0.3 0.0 - 2.0 %  Comprehensive metabolic panel   Collection Time: 10/17/15 11:20 AM  Result Value Ref Range   Sodium 139 136 - 145 mEq/L   Potassium 4.2 3.5 - 5.1 mEq/L   Chloride 107 98 - 109 mEq/L   CO2 25 22 - 29 mEq/L   Glucose 104 70 - 140 mg/dl   BUN 11.0 7.0 - 26.0 mg/dL   Creatinine 0.8 0.6 - 1.1 mg/dL   Total Bilirubin 0.79 0.20 - 1.20 mg/dL   Alkaline Phosphatase 69 40 - 150 U/L   AST 20 5 - 34 U/L   ALT 17 0 - 55 U/L   Total Protein 7.4 6.4 - 8.3 g/dL   Albumin 3.6 3.5 - 5.0 g/dL   Calcium 9.0 8.4 - 10.4 mg/dL   Anion Gap 7 3 - 11 mEq/L   EGFR 89 (L) >90 ml/min/1.73 m2    STUDIES:  CLINICAL DATA: Screening.  EXAM: DIGITAL SCREENING UNILATERAL RIGHT MAMMOGRAM WITH IMPLANTS AND CAD  The patient has a retropectoral implant. Standard and implant displaced views were performed.  COMPARISON: Previous exam(s).  ACR Breast Density Category b: There are scattered areas of fibroglandular density.  FINDINGS: The patient has had a left mastectomy. There are no findings suspicious for malignancy. <CAD Text>  IMPRESSION: No mammographic evidence of malignancy. A result letter of this screening mammogram will be mailed directly to the patient.  RECOMMENDATION: Screening mammogram in one year. (Code:SM-R-26M)  BI-RADS CATEGORY 1: Negative.   Electronically  Signed  By: Margarette Canada M.D.  On: 08/08/2015 16:41       ASSESSMENT: 45 y.o. BRCA negative [place], [place] Mandarin speaker:  1. Status post left mastectomy on 10/31/2009 with sentinel node biopsy for a T1b N0, stage IA, invasive ductal carcinoma of the left breast, grade 2, ER 0%, PR 0%, KIi-67 83%, HER-2/neu amplified by CISH with a ratio of 4.71. The patient had 2/3 positive lymph nodes for isolated tumor cells.  2.  S/p adjuvant Taxotere/Carboplatin/Herceptin from 01/03/2010 through 03/06/2010 x 6 cycles. Herceptin therapy was continued until 01/08/2011. Her last echocardiogram was on 03/13/2010 which showed an ejection fraction of 60-65%.  4. The patient underwent radiation therapy from 07/03/2010 through 08/17/2010.  5. Left chest postoperative seroma and left mastectomy bed with palpable fluctuance found by 05/05/2013   6. Patient has been offered genetics testing   PLAN: Lisha is now 6 years out from definitive surgery for her breast cancer with no evidence of disease recurrence. This is very favorable.  She understands that I will be glad to release her to her primary care physician if she ever establishes herself with 1. I gave her my card so she can help him or her obtain her data.  I reassured her that the pain she is having in the chest wall is from her surgery and not from cancer. We have already obtained a chest x-ray to evaluate this and there has been really no change for quite some time and this discomfort. The only other thing we could do would be a chest CT scan. We did discuss that today but there are financial issues and in any case I feel confident that what we are dealing with his postoperative discomfort and not recurrent cancer  I have made her a tentative return appointment in one year, but if she does establish yourself with a primary care physician she can cancel that  visit. Chauncey Cruel, MD  10/17/2015, 12:33 PM

## 2016-08-23 ENCOUNTER — Other Ambulatory Visit: Payer: Self-pay

## 2016-08-23 DIAGNOSIS — Z1231 Encounter for screening mammogram for malignant neoplasm of breast: Secondary | ICD-10-CM

## 2016-08-23 DIAGNOSIS — Z9012 Acquired absence of left breast and nipple: Secondary | ICD-10-CM

## 2016-09-02 ENCOUNTER — Other Ambulatory Visit: Payer: Self-pay | Admitting: Family Medicine

## 2016-09-02 DIAGNOSIS — Z1231 Encounter for screening mammogram for malignant neoplasm of breast: Secondary | ICD-10-CM

## 2016-09-02 DIAGNOSIS — Z9012 Acquired absence of left breast and nipple: Secondary | ICD-10-CM

## 2016-09-03 ENCOUNTER — Ambulatory Visit: Payer: Self-pay | Admitting: Physician Assistant

## 2016-09-17 ENCOUNTER — Ambulatory Visit
Admission: RE | Admit: 2016-09-17 | Discharge: 2016-09-17 | Disposition: A | Discharge status: Home / Self Care | Payer: No Typology Code available for payment source | Source: Ambulatory Visit | Attending: Family Medicine | Admitting: Family Medicine

## 2016-09-17 DIAGNOSIS — Z1231 Encounter for screening mammogram for malignant neoplasm of breast: Secondary | ICD-10-CM

## 2016-09-17 DIAGNOSIS — Z9012 Acquired absence of left breast and nipple: Secondary | ICD-10-CM

## 2016-09-17 HISTORY — DX: Personal history of irradiation: Z92.3

## 2016-09-17 HISTORY — DX: Personal history of antineoplastic chemotherapy: Z92.21

## 2016-10-16 ENCOUNTER — Other Ambulatory Visit: Payer: No Typology Code available for payment source

## 2016-10-16 ENCOUNTER — Ambulatory Visit: Payer: No Typology Code available for payment source | Admitting: Oncology

## 2016-11-19 ENCOUNTER — Ambulatory Visit (HOSPITAL_BASED_OUTPATIENT_CLINIC_OR_DEPARTMENT_OTHER): Payer: Self-pay | Admitting: Oncology

## 2016-11-19 ENCOUNTER — Other Ambulatory Visit (HOSPITAL_BASED_OUTPATIENT_CLINIC_OR_DEPARTMENT_OTHER): Payer: Self-pay

## 2016-11-19 VITALS — BP 109/62 | HR 64 | Temp 97.9°F | Resp 18 | Ht 65.0 in | Wt 131.7 lb

## 2016-11-19 DIAGNOSIS — Z853 Personal history of malignant neoplasm of breast: Secondary | ICD-10-CM

## 2016-11-19 DIAGNOSIS — C50912 Malignant neoplasm of unspecified site of left female breast: Secondary | ICD-10-CM

## 2016-11-19 DIAGNOSIS — D500 Iron deficiency anemia secondary to blood loss (chronic): Secondary | ICD-10-CM

## 2016-11-19 DIAGNOSIS — C50812 Malignant neoplasm of overlapping sites of left female breast: Secondary | ICD-10-CM | POA: Insufficient documentation

## 2016-11-19 DIAGNOSIS — Z171 Estrogen receptor negative status [ER-]: Secondary | ICD-10-CM

## 2016-11-19 LAB — COMPREHENSIVE METABOLIC PANEL
ALBUMIN: 3.7 g/dL (ref 3.5–5.0)
ALK PHOS: 69 U/L (ref 40–150)
ALT: 23 U/L (ref 0–55)
AST: 23 U/L (ref 5–34)
Anion Gap: 7 mEq/L (ref 3–11)
BILIRUBIN TOTAL: 0.74 mg/dL (ref 0.20–1.20)
BUN: 10.6 mg/dL (ref 7.0–26.0)
CO2: 25 mEq/L (ref 22–29)
Calcium: 9.7 mg/dL (ref 8.4–10.4)
Chloride: 106 mEq/L (ref 98–109)
Creatinine: 0.8 mg/dL (ref 0.6–1.1)
EGFR: 89 mL/min/{1.73_m2} — AB (ref >90)
Glucose: 90 mg/dl (ref 70–140)
Potassium: 3.9 mEq/L (ref 3.5–5.1)
SODIUM: 138 meq/L (ref 136–145)
TOTAL PROTEIN: 7.4 g/dL (ref 6.4–8.3)

## 2016-11-19 LAB — CBC WITH DIFFERENTIAL/PLATELET
BASO%: 0.7 % (ref 0.0–2.0)
Basophils Absolute: 0 10*3/uL (ref 0.0–0.1)
EOS%: 0.9 % (ref 0.0–7.0)
Eosinophils Absolute: 0.1 10*3/uL (ref 0.0–0.5)
HEMATOCRIT: 35.2 % (ref 34.8–46.6)
HEMOGLOBIN: 11.4 g/dL — AB (ref 11.6–15.9)
LYMPH%: 28.7 % (ref 14.0–49.7)
MCH: 26.3 pg (ref 25.1–34.0)
MCHC: 32.3 g/dL (ref 31.5–36.0)
MCV: 81.6 fL (ref 79.5–101.0)
MONO#: 0.5 10*3/uL (ref 0.1–0.9)
MONO%: 7.6 % (ref 0.0–14.0)
NEUT%: 62.1 % (ref 38.4–76.8)
NEUTROS ABS: 4 10*3/uL (ref 1.5–6.5)
PLATELETS: 221 10*3/uL (ref 145–400)
RBC: 4.31 10*6/uL (ref 3.70–5.45)
RDW: 15.1 % — ABNORMAL HIGH (ref 11.2–14.5)
WBC: 6.4 10*3/uL (ref 3.9–10.3)
lymph#: 1.8 10*3/uL (ref 0.9–3.3)

## 2016-11-19 NOTE — Progress Notes (Signed)
[place]  Telephone:(336) [phone] Fax:(336) [phone]  OFFICE PROGRESS NOTE   ID: Lauren Cohen   DOB: 1971-02-18  MR#: [id number]  GQQ#:[id number]   SU:  Lauren A. 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 Collier Salina Cohen's new patient evaluation dated 09/13/2009:  "This is a pleasant 46 year old woman of Chinese extraction here today with an interpreter for evaluation and discussion of recent diagnosis of breast cancer. 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  She apparently has had a problem with a nipple discharge for a period of a year.  She had imaging studies at [facility] in [place] and subsequently had an FNA done at [facility].  Per history, she has been having nipple discharge for intermittently over a year.  She had implants placed as well.  Prior to that, the FNA reports suggested the presence of normal calcifications in ducts at the 11 o'clock position of the left breast 2.0 cm from the nipple.  She appeared to also have an abnormality adjacent to that.  On the 4th of April, she had an FNA of the abnormal area at 61 o' clock, 2.0 cm from the nipple, one smear was diagnostic and showed atypical cells consistent with a diagnosis of adenocarcinoma. There was an additional aspirate, which showed fibrocystic changes.  The patient returned and wished to have her further workup in [place].  She subsequently had another mammogram pre-MRI done on 08/08/2009, which showed subpectoral implants.  This plate showed heterogeneous dense breast tissue and no dominant masses were seen.  The MRI scan from 08/08/2009 showed that in the upper inner subarea of the left breast at 9:30 is a 1.2 x 1.1 x  0.6 cm ill-defined enhancing mass seen, which appeared to correspond to the calcification seen in the mammograms from [place], posterior and medially and upper inner left breast at 10 o'clock position a 1.2 x 1.2 x 0.6 cm enhancing mass was seen.  It was not clear whether this represented an additional area that was previously described at the [place] mammograms, as we do not have the copies of those reports.  Of note is that the patient had suspicious microcalcifications of the left nipple seen as well.  There was injected material seen in the subpectoral areas of both breasts.  The patient has been initially seen by Dr. Barry Cohen and now Dr. Brantley Cohen and the plan is to have her undergo a mastectomy of the left breast with reconstruction and insertion of new implants." Her subsequent history is as detailed below.  INTERVAL HISTORY: Lauren Cohen returns today for follow-up of her triple negative breast cancer accompanied by a Optometrist. The interval history is generally unremarkable. 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 Sometimes she has a little bit of discomfort in her right breast, especially superiorly. Sometimes when she stands up quickly from a sitting position she feels a little bit faint.  REVIEW OF SYSTEMS: A detailed review of systems today was otherwise noncontributory  PAST MEDICAL HISTORY: Past Medical History:  Diagnosis Date  . Anemia   . Breast cancer ([place]) 2011  . Duodenal ulcer   . Gastritis   . GI bleed   . Hepatitis  B carrier ([place]) 1998  . Hepatitis B infection   . Peptic ulcer disease   . Personal history of chemotherapy 2011  . Personal history of radiation therapy 2011  . S/P left mastectomy 2011     PAST SURGICAL HISTORY: Past Surgical History:  Procedure Laterality Date  . left modified mastectomy  2011  . MASTECTOMY Left 2011  . PORT-A-CATH REMOVAL  02/04/2011   Procedure: REMOVAL PORT-A-CATH;  Surgeon: Joyice Faster. Cornett, MD;  Location: [facility];   Service: General;  Laterality: N/A;  . PORTACATH PLACEMENT      FAMILY HISTORY Family History  Problem Relation Age of Onset  . Hepatitis Mother   . Hepatitis B Mother      GYNECOLOGIC HISTORY: Gravida 4, para 2, Menarche age 32,  Premenopausal. Most recent period was 10/03/2014. The one before that was early June.  SOCIAL HISTORY:(Updated August 2018 Married for 20 years, two teenaged sons age 72 (in college) and 71 (in the [place])  Her and her husband work in [employer]. She enjoys working on the computer and watching TV in her spare time.   ADVANCED DIRECTIVES: Not on file  HEALTH MAINTENANCE: Social History  Substance Use Topics  . Smoking status: Never Smoker  . Smokeless tobacco: Never Used  . Alcohol use No    Colonoscopy: The patient has never had a colonoscopy PAP: Her last Pap smear was on 12/17/2010 Bone density: The patient has never had a bone density scan Lipid panel: A lipid panel was ordered today.  No Known Allergies  No current outpatient prescriptions on file.   No current facility-administered medications for this visit.     OBJECTIVE: Young-appearing Asian woman In no acute distress  Vitals:   11/19/16 1407  BP: 109/62  Pulse: 64  Resp: 18  Temp: 97.9 F (36.6 C)  SpO2: 100%     Body mass index is 21.92 kg/m.      ECOG FS: 0           Sclerae unicteric, pupils round and equal Oropharynx clear and moist No cervical or supraclavicular adenopathy Lungs no rales or rhonchi Heart regular rate and rhythm Abd soft, nontender, positive bowel sounds MSK no focal spinal tenderness, no upper extremity lymphedema Neuro: nonfocal, well oriented, appropriate affect Breasts: The right breast is benign and in particular the area where she sometimes feels a little discomfort is entirely unremarkable. The left breast is status post mastectomy. Both axillae are benign.    LAB RESULTS: Results for orders placed or performed in visit on 11/19/16  (from the past 168 hour(s))  CBC with Differential/Platelet   Collection Time: 11/19/16  1:43 PM  Result Value Ref Range   WBC 6.4 3.9 - 10.3 10e3/uL   NEUT# 4.0 1.5 - 6.5 10e3/uL   HGB 11.4 (L) 11.6 - 15.9 g/dL   HCT 35.2 34.8 - 46.6 %   Platelets 221 145 - 400 10e3/uL   MCV 81.6 79.5 - 101.0 fL   MCH 26.3 25.1 - 34.0 pg   MCHC 32.3 31.5 - 36.0 g/dL   RBC 4.31 3.70 - 5.45 10e6/uL   RDW 15.1 (H) 11.2 - 14.5 %   lymph# 1.8 0.9 - 3.3 10e3/uL   MONO# 0.5 0.1 - 0.9 10e3/uL   Eosinophils Absolute 0.1 0.0 - 0.5 10e3/uL   Basophils Absolute 0.0 0.0 - 0.1 10e3/uL   NEUT% 62.1 38.4 - 76.8 %   LYMPH% 28.7 14.0 - 49.7 %   MONO% 7.6 0.0 -  14.0 %   EOS% 0.9 0.0 - 7.0 %   BASO% 0.7 0.0 - 2.0 %    STUDIES: [facility] found a breast density to be category C. She has retropectoral implants in place. There was no evidence of malignancy.  In the labs please note the drop in hemoglobin and the drop in MCV as well as the rise in the RDW, all indicative of mild iron deficiency  ASSESSMENT: 46 y.o. BRCA negative [place], [place] Mandarin speaker:  1. Status post left mastectomy on 10/31/2009 with sentinel node biopsy for a T1b N0, Cohen IA, invasive ductal carcinoma of the left breast, grade 2, ER 0%, PR 0%, KIi-67 83%, HER-2/neu amplified by CISH with a ratio of 4.71. The patient had 2/3 positive lymph nodes for isolated tumor cells.  2.  S/p adjuvant Taxotere/Carboplatin/Herceptin from 01/03/2010 through 03/06/2010 x 6 cycles. Herceptin therapy was continued until 01/08/2011. Her last echocardiogram was on 03/13/2010 which showed an ejection fraction of 60-65%.  4. The patient underwent radiation therapy from 07/03/2010 through 08/17/2010.  5. Left chest postoperative seroma and left mastectomy bed with palpable fluctuance found by 05/05/2013   6. Patient has been offered genetics testing   PLAN: Tykeisha is now 7 years out from  definitive surgery for her breast cancer with no evidence of disease recurrence. This is very favorable.  I reassured her that the discomfort she is feeling in the right breast is benign and I gave her a copy of her mammogram.  After much questioning today I was still not able to ascertain if she has someone that she identifies as her primary care physician. Accordingly we are going to continue to follow her on a yearly basis until she completes her 10th year.  I suggested she hydrate herself a little bit better. She needs to start an iron pill daily because she has developed mild iron deficiency. Recall she is still menstruating  Otherwise she knows to call for any problems that may develop before her next visit here. Chauncey Cruel, MD  11/19/2016, 2:11 PM

## 2017-09-18 ENCOUNTER — Other Ambulatory Visit: Payer: Self-pay | Admitting: Family Medicine

## 2017-09-19 ENCOUNTER — Other Ambulatory Visit: Payer: Self-pay | Admitting: Oncology

## 2017-09-19 DIAGNOSIS — Z1231 Encounter for screening mammogram for malignant neoplasm of breast: Secondary | ICD-10-CM

## 2017-10-08 ENCOUNTER — Telehealth: Payer: Self-pay | Admitting: Oncology

## 2017-10-08 NOTE — Telephone Encounter (Signed)
Tried to reach regarding date change °

## 2017-10-14 ENCOUNTER — Ambulatory Visit
Admission: RE | Admit: 2017-10-14 | Discharge: 2017-10-14 | Disposition: A | Discharge status: Home / Self Care | Payer: No Typology Code available for payment source | Source: Ambulatory Visit | Attending: Oncology | Admitting: Oncology

## 2017-10-14 DIAGNOSIS — Z1231 Encounter for screening mammogram for malignant neoplasm of breast: Secondary | ICD-10-CM

## 2017-11-19 ENCOUNTER — Other Ambulatory Visit: Payer: No Typology Code available for payment source

## 2017-11-19 ENCOUNTER — Ambulatory Visit: Payer: No Typology Code available for payment source | Admitting: Oncology

## 2017-11-25 ENCOUNTER — Inpatient Hospital Stay: Payer: Self-pay | Attending: Adult Health

## 2017-11-25 ENCOUNTER — Encounter: Payer: Self-pay | Admitting: Adult Health

## 2017-11-25 ENCOUNTER — Telehealth: Payer: Self-pay | Admitting: Adult Health

## 2017-11-25 ENCOUNTER — Inpatient Hospital Stay (HOSPITAL_BASED_OUTPATIENT_CLINIC_OR_DEPARTMENT_OTHER): Payer: Self-pay | Admitting: Adult Health

## 2017-11-25 VITALS — BP 104/51 | HR 67 | Temp 98.3°F | Resp 18 | Ht 65.0 in | Wt 134.2 lb

## 2017-11-25 DIAGNOSIS — Z923 Personal history of irradiation: Secondary | ICD-10-CM

## 2017-11-25 DIAGNOSIS — Z9221 Personal history of antineoplastic chemotherapy: Secondary | ICD-10-CM

## 2017-11-25 DIAGNOSIS — Z171 Estrogen receptor negative status [ER-]: Principal | ICD-10-CM

## 2017-11-25 DIAGNOSIS — Z853 Personal history of malignant neoplasm of breast: Secondary | ICD-10-CM

## 2017-11-25 DIAGNOSIS — C50812 Malignant neoplasm of overlapping sites of left female breast: Secondary | ICD-10-CM

## 2017-11-25 DIAGNOSIS — Z1239 Encounter for other screening for malignant neoplasm of breast: Secondary | ICD-10-CM

## 2017-11-25 DIAGNOSIS — D649 Anemia, unspecified: Secondary | ICD-10-CM

## 2017-11-25 DIAGNOSIS — C50912 Malignant neoplasm of unspecified site of left female breast: Secondary | ICD-10-CM

## 2017-11-25 LAB — CBC WITH DIFFERENTIAL/PLATELET
Basophils Absolute: 0 10*3/uL (ref 0.0–0.1)
Basophils Relative: 1 %
EOS PCT: 3 %
Eosinophils Absolute: 0.1 10*3/uL (ref 0.0–0.5)
HCT: 34.3 % — ABNORMAL LOW (ref 34.8–46.6)
HEMOGLOBIN: 11 g/dL — AB (ref 11.6–15.9)
Lymphocytes Relative: 40 %
Lymphs Abs: 1.9 10*3/uL (ref 0.9–3.3)
MCH: 26.6 pg (ref 25.1–34.0)
MCHC: 32.1 g/dL (ref 31.5–36.0)
MCV: 83.1 fL (ref 79.5–101.0)
Monocytes Absolute: 0.5 10*3/uL (ref 0.1–0.9)
Monocytes Relative: 9 %
Neutro Abs: 2.3 10*3/uL (ref 1.5–6.5)
Neutrophils Relative %: 47 %
PLATELETS: 191 10*3/uL (ref 145–400)
RBC: 4.13 MIL/uL (ref 3.70–5.45)
RDW: 14.4 % (ref 11.2–14.5)
WBC: 4.9 10*3/uL (ref 3.9–10.3)

## 2017-11-25 LAB — COMPREHENSIVE METABOLIC PANEL
ALK PHOS: 70 U/L (ref 38–126)
ALT: 20 U/L (ref 0–44)
AST: 20 U/L (ref 15–41)
Albumin: 3.9 g/dL (ref 3.5–5.0)
Anion gap: 8 (ref 5–15)
BUN: 10 mg/dL (ref 6–20)
CALCIUM: 9.1 mg/dL (ref 8.9–10.3)
CO2: 26 mmol/L (ref 22–32)
CREATININE: 0.78 mg/dL (ref 0.44–1.00)
Chloride: 107 mmol/L (ref 98–111)
Glucose, Bld: 89 mg/dL (ref 70–99)
Potassium: 3.6 mmol/L (ref 3.5–5.1)
Sodium: 141 mmol/L (ref 135–145)
Total Bilirubin: 1 mg/dL (ref 0.3–1.2)
Total Protein: 7.4 g/dL (ref 6.5–8.1)

## 2017-11-25 NOTE — Patient Instructions (Addendum)
You have no clinical or radiographic sign of recurrence.  I recommend that you get a primary care provider to ensure that you undergo cancer screenings and annual lab work as indicated.  Your hemoglobin is slightly low.  I would recommend you see gynecology for your abnormal menstrual cycles.    I did order an xray regarding your concern for your rib cage protruding more than usual.    We will see you back in one year!

## 2017-11-25 NOTE — Progress Notes (Signed)
[place]  Telephone:([phone] Fax:(336) [phone]  OFFICE PROGRESS NOTE   ID: Lauren Cohen   DOB: 06/24/1970  MR#: [id number]  RJJ#:[id number]   SU:  Thomas A. Cornett, MD OTHER MD: Ileene Patrick, MD  CHIEF COMPLAINT: HER-2 positive breast cancer  CURRENT TREATMENT: Observation   HISTORY OF PRESENT ILLNESS: From Dr. [name]'s new patient evaluation dated 09/13/2009:  "This is a pleasant 47 year old woman of Chinese extraction here today with an interpreter for evaluation and discussion of recent diagnosis of breast cancer. 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  She apparently has had a problem with a nipple discharge for a period of a year.  She had imaging studies at [facility] in [place] and subsequently had an FNA done at [facility].  Per history, she has been having nipple discharge for intermittently over a year.  She had implants placed as well.  Prior to that, the FNA reports suggested the presence of normal calcifications in ducts at the 11 o'clock position of the left breast 2.0 cm from the nipple.  She appeared to also have an abnormality adjacent to that.  On the 4th of April, she had an FNA of the abnormal area at 72 o' clock, 2.0 cm from the nipple, one smear was diagnostic and showed atypical cells consistent with a diagnosis of adenocarcinoma. There was an additional aspirate, which showed fibrocystic changes.  The patient returned and wished to have her further workup in [place].  She subsequently had another mammogram pre-MRI done on 08/08/2009, which showed subpectoral implants.  This plate showed heterogeneous dense breast tissue and no dominant masses were seen.  The MRI scan from 08/08/2009 showed that in the upper inner subarea of the left breast at 9:30 is a 1.2 x 1.1 x  0.6 cm ill-defined enhancing mass seen, which appeared to correspond to the calcification seen in the mammograms from [place], posterior and medially and upper inner left breast at 10 o'clock position a 1.2 x 1.2 x 0.6 cm enhancing mass was seen.  It was not clear whether this represented an additional area that was previously described at the [place] mammograms, as we do not have the copies of those reports.  Of note is that the patient had suspicious microcalcifications of the left nipple seen as well.  There was injected material seen in the subpectoral areas of both breasts.  The patient has been initially seen by Dr. Barry Dienes and now Dr. Brantley Stage and the plan is to have her undergo a mastectomy of the left breast with reconstruction and insertion of new implants." Her subsequent history is as detailed below.  INTERVAL HISTORY: Lauren Cohen returns today for follow-up of her HER-2 positive breast cancer accompanied by a mandarin chinese translator Lauren Cohen.  Lauren Cohen tells me that her English name is Lauren Cohen.  "Lauren Cohen" is doing well today.  She notes some intermittent cramping in her left breast.  She also feels like her left rib cage protrudes more than the right and she is very concerned as to why this is abnormal.    REVIEW OF SYSTEMS: She does not have a PCP.  She underwent PAP one year ago with [facility].  She exercises every morning.    Her hemoglobin is 11.  This is decreased.  After three different versions of  what is going on with her menstrual cycles from the interpreter, I finally concluded twice that she notes that she has cycles 2 weeks on and 2 weeks off, and for two days she will have heavy bleeding, with 5 pad days.  Then the rest of the time she has light cycles.   Otherwise she is feeling well and a detailed ROS is non contributory.    PAST MEDICAL HISTORY: Past Medical History:  Diagnosis Date  . Anemia   . Breast cancer ([place]) 2011  . Duodenal ulcer   . Gastritis   . GI bleed   . Hepatitis  B carrier ([place]) 1998  . Hepatitis B infection   . Peptic ulcer disease   . Personal history of chemotherapy 2011  . Personal history of radiation therapy 2011  . S/P left mastectomy 2011     PAST SURGICAL HISTORY: Past Surgical History:  Procedure Laterality Date  . left modified mastectomy  2011  . MASTECTOMY Left 2011  . PORT-A-CATH REMOVAL  02/04/2011   Procedure: REMOVAL PORT-A-CATH;  Surgeon: Joyice Faster. Cornett, MD;  Location: [place];  Service: General;  Laterality: N/A;  . PORTACATH PLACEMENT      FAMILY HISTORY Family History  Problem Relation Age of Onset  . Hepatitis Mother   . Hepatitis B Mother      GYNECOLOGIC HISTORY: Gravida 4, para 2, Menarche age 62,  Premenopausal. Most recent period was 10/03/2014. The one before that was early June.  SOCIAL HISTORY:(Updated August 2018 Married for 20 years, two teenaged sons age 66 (in college) and 63 (in the [place])  Her and her husband work in [employer]. She enjoys working on the computer and watching TV in her spare time.   ADVANCED DIRECTIVES: Not on file  HEALTH MAINTENANCE: Social History   Tobacco Use  . Smoking status: Never Smoker  . Smokeless tobacco: Never Used  Substance Use Topics  . Alcohol use: No  . Drug use: No    Colonoscopy: The patient has never had a colonoscopy PAP: Her last Pap smear was on 12/17/2010 Bone density: The patient has never had a bone density scan Lipid panel: A lipid panel was ordered today.  No Known Allergies  No current outpatient medications on file.   No current facility-administered medications for this visit.     OBJECTIVE:  Vitals:   11/25/17 1409  BP: (!) 104/51  Pulse: 67  Resp: 18  Temp: 98.3 F (36.8 C)  SpO2: 100%     Body mass index is 22.33 kg/m.      ECOG FS: 1 GENERAL: Patient is a well appearing female in no acute distress HEENT:  Sclerae anicteric.  Oropharynx clear and moist. No ulcerations or evidence of oropharyngeal candidiasis. Neck is  supple.  NODES:  No cervical, supraclavicular, or axillary lymphadenopathy palpated.  BREAST EXAM:  Right breast without nodules, masses, skin or nipple changes, left chest wall s/p mastectomy, no nodules, masses, skin or nipple changes LUNGS:  Clear to auscultation bilaterally.  No wheezes or rhonchi. HEART:  Regular rate and rhythm. No murmur appreciated. ABDOMEN:  Soft, nontender.  Positive, normoactive bowel sounds. No organomegaly palpated. MSK:  No focal spinal tenderness to palpation. Full range of motion bilaterally in the upper extremities. EXTREMITIES:  No peripheral edema.   SKIN:  Clear with no obvious rashes or skin changes. No nail dyscrasia. NEURO:  Nonfocal. Well oriented.  Appropriate affect.      LAB RESULTS: Results for orders placed or  performed in visit on 11/25/17 (from the past 168 hour(s))  CBC with Differential/Platelet   Collection Time: 11/25/17  1:35 PM  Result Value Ref Range   WBC 4.9 3.9 - 10.3 K/uL   RBC 4.13 3.70 - 5.45 MIL/uL   Hemoglobin 11.0 (L) 11.6 - 15.9 g/dL   HCT 34.3 (L) 34.8 - 46.6 %   MCV 83.1 79.5 - 101.0 fL   MCH 26.6 25.1 - 34.0 pg   MCHC 32.1 31.5 - 36.0 g/dL   RDW 14.4 11.2 - 14.5 %   Platelets 191 145 - 400 K/uL   Neutrophils Relative % 47 %   Neutro Abs 2.3 1.5 - 6.5 K/uL   Lymphocytes Relative 40 %   Lymphs Abs 1.9 0.9 - 3.3 K/uL   Monocytes Relative 9 %   Monocytes Absolute 0.5 0.1 - 0.9 K/uL   Eosinophils Relative 3 %   Eosinophils Absolute 0.1 0.0 - 0.5 K/uL   Basophils Relative 1 %   Basophils Absolute 0.0 0.0 - 0.1 K/uL  Comprehensive metabolic panel   Collection Time: 11/25/17  1:35 PM  Result Value Ref Range   Sodium 141 135 - 145 mmol/L   Potassium 3.6 3.5 - 5.1 mmol/L   Chloride 107 98 - 111 mmol/L   CO2 26 22 - 32 mmol/L   Glucose, Bld 89 70 - 99 mg/dL   BUN 10 6 - 20 mg/dL   Creatinine, Ser 0.78 0.44 - 1.00 mg/dL   Calcium 9.1 8.9 - 10.3 mg/dL   Total Protein 7.4 6.5 - 8.1 g/dL   Albumin 3.9 3.5 - 5.0  g/dL   AST 20 15 - 41 U/L   ALT 20 0 - 44 U/L   Alkaline Phosphatase 70 38 - 126 U/L   Total Bilirubin 1.0 0.3 - 1.2 mg/dL   GFR calc non Af Amer >60 >60 mL/min   GFR calc Af Amer >60 >60 mL/min   Anion gap 8 5 - 15    STUDIES:  ASSESSMENT: 47 y.o. BRCA negative [place], [place] Mandarin speaker:  1. Status post left mastectomy on 10/31/2009 with sentinel node biopsy for a T1b N0, stage IA, invasive ductal carcinoma of the left breast, grade 2, ER 0%, PR 0%, KIi-67 83%, HER-2/neu amplified by CISH with a ratio of 4.71. The patient had 2/3 positive lymph nodes for isolated tumor cells.  2.  S/p adjuvant Taxotere/Carboplatin/Herceptin from 01/03/2010 through 03/06/2010 x 6 cycles. Herceptin therapy was continued until 01/08/2011. Her last echocardiogram was on 03/13/2010 which showed an ejection fraction of 60-65%.  4. The patient underwent radiation therapy from 07/03/2010 through 08/17/2010.  5. Left chest postoperative seroma and left mastectomy bed with palpable fluctuance found by 05/05/2013   6. Patient has been offered genetics testing   PLAN: Lauren Cohen or "Lauren Cohen" is doing well today.  She has no clinical or radiographic sign of recurrence.  She will be due for repeat right breast screening mammogram in 09/2018.  I ordered a chest xray due to her concern over the rib protrusion and the chest wall pain. She opted not to have it done.  I reviewed that she does have a mild anemia, and I recommended work up with gynecology regarding heavy bleeding first.  Then possibly GI.  I encouraged continued healthy diet and exercise. I recommended she get a PCP for routine health screening.   I would like to note that I had a difficult time getting on the same page as the patient with  the interpreter, and there continued to be a communication barrier despite having an interpreter present.  Due to this, I typed up my recommendations at the completion of the appointment in the AVS and printed it  for them to review.    We will see her back in one year.  She knows to call for any questions between now and then.    A total of (50) minutes of face-to-face time was spent with this patient with greater than 50% of that time in counseling and care-coordination.   Scot Dock, NP  11/26/2017, 3:10 PM

## 2017-11-25 NOTE — Telephone Encounter (Signed)
Gave patient avs and calendar.  Also gave phone number for financial resource as patient does not have insurance per interpreter.

## 2018-11-30 ENCOUNTER — Other Ambulatory Visit: Payer: Self-pay

## 2018-11-30 DIAGNOSIS — C50812 Malignant neoplasm of overlapping sites of left female breast: Secondary | ICD-10-CM

## 2018-11-30 NOTE — Progress Notes (Signed)
Lauren Cohen  Telephone:([phone] Fax:(336) [phone]  OFFICE PROGRESS NOTE   ID: Lauren Cohen   DOB: 1970/09/06  MR#: [id number]  WCB#:[id number]   SU:  Lauren A. Cornett, MD OTHER MD: Lauren Patrick, MD   CHIEF COMPLAINT: HER-2 positive breast cancer  CURRENT TREATMENT: Observation   HISTORY OF PRESENT ILLNESS: From Dr. Collier Salina Cohen's new patient evaluation dated 09/13/2009:  "This is a pleasant 48 year old woman of Chinese extraction here today with an interpreter for evaluation and discussion of recent diagnosis of breast cancer. 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  She apparently has had a problem with a nipple discharge for a period of a year.  She had imaging studies at [facility] in [place] and subsequently had an FNA done at [facility].  Per history, she has been having nipple discharge for intermittently over a year.  She had implants placed as well.  Prior to that, the FNA reports suggested the presence of normal calcifications in ducts at the 11 o'clock position of the left breast 2.0 cm from the nipple.  She appeared to also have an abnormality adjacent to that.  On the 4th of April, she had an FNA of the abnormal area at 75 o' clock, 2.0 cm from the nipple, one smear was diagnostic and showed atypical cells consistent with a diagnosis of adenocarcinoma. There was an additional aspirate, which showed fibrocystic changes.  The patient returned and wished to have her further workup in [place].  She subsequently had another mammogram pre-MRI done on 08/08/2009, which showed subpectoral implants.  This plate showed heterogeneous dense breast tissue and no dominant masses were seen.  The MRI scan from 08/08/2009 showed that in the upper inner subarea of the left breast at 9:30 is a 1.2 x 1.1  x 0.6 cm ill-defined enhancing mass seen, which appeared to correspond to the calcification seen in the mammograms from [place], posterior and medially and upper inner left breast at 10 o'clock position a 1.2 x 1.2 x 0.6 cm enhancing mass was seen.  It was not clear whether this represented an additional area that was previously described at the [place] mammograms, as we do not have the copies of those reports.  Of note is that the patient had suspicious microcalcifications of the left nipple seen as well.  There was injected material seen in the subpectoral areas of both breasts.  The patient has been initially seen by Dr. Barry Cohen and now Dr. Brantley Cohen and the plan is to have her undergo a mastectomy of the left breast with reconstruction and insertion of new implants." Her subsequent history is as detailed below.   INTERVAL HISTORY: Lauren Cohen returns today for follow-up and treatment of her HER-2 positive breast cancer. She was last seen here on 11/25/2017.  She continues under observation.  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  She is enjoying staying home.  They are taking appropriate pandemic precautions.  Since her last visit here, she has not undergone any additional studies. She is behind on her annual mammography, which was last completed on 10/14/2017 at [facility].    REVIEW OF SYSTEMS: Lauren Cohen tells me that she walks for exercise.  She has regular periods which last almost 2 weeks each time.  Aside from  these issues the detailed review of systems today was noncontributory   PAST MEDICAL HISTORY: Past Medical History:  Diagnosis Date  . Anemia   . Breast cancer ([place]) 2011  . Duodenal ulcer   . Gastritis   . GI bleed   . Hepatitis B carrier ([place]) 1998  . Hepatitis B infection   . Peptic ulcer disease   . Personal history of chemotherapy 2011  . Personal history of radiation therapy 2011  . S/P left mastectomy 2011     PAST SURGICAL HISTORY: Past  Surgical History:  Procedure Laterality Date  . left modified mastectomy  2011  . MASTECTOMY Left 2011  . PORT-A-CATH REMOVAL  02/04/2011   Procedure: REMOVAL PORT-A-CATH;  Surgeon: Lauren Faster. Cornett, MD;  Location: [place];  Service: General;  Laterality: N/A;  . PORTACATH PLACEMENT      FAMILY HISTORY Family History  Problem Relation Age of Onset  . Hepatitis Mother   . Hepatitis B Mother      GYNECOLOGIC HISTORY: Gravida 4, para 2, Menarche age 29,  Premenopausal.  Continues to have regular periods  SOCIAL HISTORY:(Updated September 2019) Married for 20+ years, two teenaged sons age 74 (looking for a job) and 21 (in [place], academic)  She and her husband work in [employer], which is currently closed because of the pandemic. She enjoys working on the computer and watching TV in her spare time.   ADVANCED DIRECTIVES: Not on file  HEALTH MAINTENANCE: Social History   Tobacco Use  . Smoking status: Never Smoker  . Smokeless tobacco: Never Used  Substance Use Topics  . Alcohol use: No  . Drug use: No    No Known Allergies  Current Outpatient Medications  Medication Sig Dispense Refill  . Ferrous Fumarate 63 (20 Fe) MG TABS Take 1 tablet by mouth every morning. 90 tablet 4   No current facility-administered medications for this visit.     OBJECTIVE: Young appearing [place] woman in no acute distress  Vitals:   12/01/18 1110  BP: (!) 124/57  Pulse: 75  Resp: 18  Temp: 98.7 F (37.1 C)  SpO2: 100%   Wt Readings from Last 3 Encounters:  12/01/18 132 lb 6.4 oz (60.1 kg)  11/25/17 134 lb 3.2 oz (60.9 kg)  11/19/16 131 lb 11.2 oz (59.7 kg)   Body mass index is 22.03 kg/m.    ECOG FS:0 - Asymptomatic  Ocular: Sclerae unicteric, pupils round and equal Ear-nose-throat: Wearing a mask Lymphatic: No cervical or supraclavicular adenopathy Lungs no rales or rhonchi Heart regular rate and rhythm Abd soft, nontender, positive bowel sounds MSK no focal spinal  tenderness, no joint edema Neuro: non-focal, well-oriented, appropriate affect Breasts: The right breast is unremarkable.  The left breast is status post mastectomy.  There is no evidence of disease recurrence.  Both axillae are benign.   LAB RESULTS: Results for orders placed or performed in visit on 12/01/18 (from the past 168 hour(s))  CBC with Differential ([place] Only)   Collection Time: 12/01/18 11:05 AM  Result Value Ref Range   WBC Count 4.8 4.0 - 10.5 K/uL   RBC 4.26 3.87 - 5.11 MIL/uL   Hemoglobin 10.6 (L) 12.0 - 15.0 g/dL   HCT 34.4 (L) 36.0 - 46.0 %   MCV 80.8 80.0 - 100.0 fL   MCH 24.9 (L) 26.0 - 34.0 pg   MCHC 30.8 30.0 - 36.0 g/dL   RDW 15.1 11.5 - 15.5 %   Platelet Count 254  150 - 400 K/uL   nRBC 0.0 0.0 - 0.2 %   Neutrophils Relative % 53 %   Neutro Abs 2.6 1.7 - 7.7 K/uL   Lymphocytes Relative 35 %   Lymphs Abs 1.7 0.7 - 4.0 K/uL   Monocytes Relative 9 %   Monocytes Absolute 0.4 0.1 - 1.0 K/uL   Eosinophils Relative 2 %   Eosinophils Absolute 0.1 0.0 - 0.5 K/uL   Basophils Relative 1 %   Basophils Absolute 0.0 0.0 - 0.1 K/uL   Immature Granulocytes 0 %   Abs Immature Granulocytes 0.01 0.00 - 0.07 K/uL    STUDIES: No results found.   ASSESSMENT: 48 y.o. BRCA negative Lauren Cohen, [place] Mandarin speaker:  1. Status post left mastectomy on 10/31/2009 with sentinel node biopsy for a T1b N0, Cohen IA, invasive ductal carcinoma of the left breast, grade 2, ER 0%, PR 0%, KIi-67 83%, HER-2/neu amplified by CISH with a ratio of 4.71. The patient had 2/3 positive lymph nodes for isolated tumor cells.  2.  S/p adjuvant Taxotere/Carboplatin/Herceptin from 01/03/2010 through 03/06/2010 x 6 cycles. Herceptin therapy was continued until 01/08/2011. Her last echocardiogram was on 03/13/2010 which showed an ejection fraction of 60-65%.  4. The patient underwent radiation therapy from 07/03/2010 through 08/17/2010.  5. Left chest postoperative seroma and  left mastectomy bed with palpable fluctuance found by 05/05/2013   6. Patient has been offered genetics testing   PLAN: "Lauren Cohen" is now 9 years out from definitive surgery for her breast cancer with no evidence of disease recurrence.  This is very favorable.  She missed her mammogram this year because of the pandemic and she does not want to have one this year.  She is agreeable to having one next year and I have put that in for May.  She is anemic likely secondary to her heavy periods.  I put in a prescription for ferrous femoro 8 2 for her to take daily and I wrote it all out for her.  She will see me again in June of next year.  She knows to call for any other issue that may develop before then  Magrinat, Virgie Dad, MD  12/01/18 11:31 AM Medical Oncology and Hematology [facility] [place], [place] [address] Tel. [phone]    Fax. [phone]  I, Jacqualyn Posey am acting as a Education administrator for Chauncey Cruel, MD.   I, Lurline Del MD, have reviewed the above documentation for accuracy and completeness, and I agree with the above.

## 2018-12-01 ENCOUNTER — Inpatient Hospital Stay: Payer: Self-pay | Attending: Oncology

## 2018-12-01 ENCOUNTER — Other Ambulatory Visit: Payer: Self-pay | Admitting: *Deleted

## 2018-12-01 ENCOUNTER — Other Ambulatory Visit: Payer: Self-pay

## 2018-12-01 ENCOUNTER — Inpatient Hospital Stay (HOSPITAL_BASED_OUTPATIENT_CLINIC_OR_DEPARTMENT_OTHER): Payer: Self-pay | Admitting: Oncology

## 2018-12-01 ENCOUNTER — Other Ambulatory Visit: Payer: Self-pay | Admitting: Oncology

## 2018-12-01 VITALS — BP 124/57 | HR 75 | Temp 98.7°F | Resp 18 | Wt 132.4 lb

## 2018-12-01 DIAGNOSIS — C50812 Malignant neoplasm of overlapping sites of left female breast: Secondary | ICD-10-CM

## 2018-12-01 DIAGNOSIS — D508 Other iron deficiency anemias: Secondary | ICD-10-CM

## 2018-12-01 DIAGNOSIS — Z9012 Acquired absence of left breast and nipple: Secondary | ICD-10-CM | POA: Insufficient documentation

## 2018-12-01 DIAGNOSIS — Z171 Estrogen receptor negative status [ER-]: Secondary | ICD-10-CM

## 2018-12-01 DIAGNOSIS — C50212 Malignant neoplasm of upper-inner quadrant of left female breast: Secondary | ICD-10-CM | POA: Insufficient documentation

## 2018-12-01 DIAGNOSIS — D500 Iron deficiency anemia secondary to blood loss (chronic): Secondary | ICD-10-CM | POA: Insufficient documentation

## 2018-12-01 DIAGNOSIS — N920 Excessive and frequent menstruation with regular cycle: Secondary | ICD-10-CM | POA: Insufficient documentation

## 2018-12-01 DIAGNOSIS — Z79899 Other long term (current) drug therapy: Secondary | ICD-10-CM | POA: Insufficient documentation

## 2018-12-01 DIAGNOSIS — D509 Iron deficiency anemia, unspecified: Secondary | ICD-10-CM | POA: Insufficient documentation

## 2018-12-01 DIAGNOSIS — Z1231 Encounter for screening mammogram for malignant neoplasm of breast: Secondary | ICD-10-CM

## 2018-12-01 LAB — CBC WITH DIFFERENTIAL (CANCER CENTER ONLY)
Abs Immature Granulocytes: 0.01 10*3/uL (ref 0.00–0.07)
Basophils Absolute: 0 10*3/uL (ref 0.0–0.1)
Basophils Relative: 1 %
Eosinophils Absolute: 0.1 10*3/uL (ref 0.0–0.5)
Eosinophils Relative: 2 %
HCT: 34.4 % — ABNORMAL LOW (ref 36.0–46.0)
Hemoglobin: 10.6 g/dL — ABNORMAL LOW (ref 12.0–15.0)
Immature Granulocytes: 0 %
Lymphocytes Relative: 35 %
Lymphs Abs: 1.7 10*3/uL (ref 0.7–4.0)
MCH: 24.9 pg — ABNORMAL LOW (ref 26.0–34.0)
MCHC: 30.8 g/dL (ref 30.0–36.0)
MCV: 80.8 fL (ref 80.0–100.0)
Monocytes Absolute: 0.4 10*3/uL (ref 0.1–1.0)
Monocytes Relative: 9 %
Neutro Abs: 2.6 10*3/uL (ref 1.7–7.7)
Neutrophils Relative %: 53 %
Platelet Count: 254 10*3/uL (ref 150–400)
RBC: 4.26 MIL/uL (ref 3.87–5.11)
RDW: 15.1 % (ref 11.5–15.5)
WBC Count: 4.8 10*3/uL (ref 4.0–10.5)
nRBC: 0 % (ref 0.0–0.2)

## 2018-12-01 LAB — CMP (CANCER CENTER ONLY)
ALT: 19 U/L (ref 0–44)
AST: 19 U/L (ref 15–41)
Albumin: 3.9 g/dL (ref 3.5–5.0)
Alkaline Phosphatase: 81 U/L (ref 38–126)
Anion gap: 6 (ref 5–15)
BUN: 9 mg/dL (ref 6–20)
CO2: 26 mmol/L (ref 22–32)
Calcium: 8.7 mg/dL — ABNORMAL LOW (ref 8.9–10.3)
Chloride: 107 mmol/L (ref 98–111)
Creatinine: 0.81 mg/dL (ref 0.44–1.00)
GFR, Est AFR Am: >60 mL/min (ref >60)
GFR, Estimated: >60 mL/min (ref >60)
Glucose, Bld: 91 mg/dL (ref 70–99)
Potassium: 3.7 mmol/L (ref 3.5–5.1)
Sodium: 139 mmol/L (ref 135–145)
Total Bilirubin: 0.7 mg/dL (ref 0.3–1.2)
Total Protein: 7.3 g/dL (ref 6.5–8.1)

## 2018-12-01 MED ORDER — FERROUS FUMARATE 63 (20 FE) MG PO TABS: 1.0000 | ORAL_TABLET | Freq: Every morning | ORAL | 4 refills | Status: DC

## 2018-12-01 MED ORDER — FERROUS FUMARATE 324 (106 FE) MG PO TABS: 1.0000 | ORAL_TABLET | Freq: Every day | ORAL | 6 refills | Status: AC

## 2018-12-02 ENCOUNTER — Telehealth: Payer: Self-pay | Admitting: Oncology

## 2018-12-02 NOTE — Telephone Encounter (Signed)
I could not reach patient regarding schedule  °

## 2019-08-11 ENCOUNTER — Other Ambulatory Visit: Payer: Self-pay | Admitting: Oncology

## 2019-08-11 ENCOUNTER — Other Ambulatory Visit: Payer: Self-pay

## 2019-08-11 ENCOUNTER — Ambulatory Visit
Admission: RE | Admit: 2019-08-11 | Discharge: 2019-08-11 | Disposition: A | Discharge status: Home / Self Care | Payer: Self-pay | Source: Ambulatory Visit | Attending: Oncology | Admitting: Oncology

## 2019-08-11 DIAGNOSIS — Z1231 Encounter for screening mammogram for malignant neoplasm of breast: Secondary | ICD-10-CM

## 2019-09-13 NOTE — Progress Notes (Signed)
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   REVIEW OF SYSTEMS: Lauren Cohen    HISTORY OF PRESENT ILLNESS: From Dr. [name]'s new patient evaluation dated 09/13/2009:  "This is a pleasant 49 year old woman of Chinese extraction here today with an interpreter for evaluation and discussion of recent diagnosis of breast cancer. 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  She apparently has had a problem with a nipple discharge for a period of a year.  She had imaging studies at [facility] in [place] and subsequently had an FNA done at [facility].  Per history, she has been having nipple discharge for intermittently over a year.  She had implants placed as well.  Prior to that, the FNA reports suggested the presence of normal calcifications in ducts at the 11 o'clock position of the left breast 2.0 cm from the nipple.  She appeared to also have an abnormality adjacent to that.  On the 4th of April, she had an FNA of the abnormal area at 29 o' clock, 2.0 cm from the nipple, one smear was diagnostic and showed atypical cells consistent with a diagnosis of adenocarcinoma. There was an additional aspirate, which  showed fibrocystic changes.  The patient returned and wished to have her further workup in [place].  She subsequently had another mammogram pre-MRI done on 08/08/2009, which showed subpectoral implants.  This plate showed heterogeneous dense breast tissue and no dominant masses were seen.  The MRI scan from 08/08/2009 showed that in the upper inner subarea of the left breast at 9:30 is a 1.2 x 1.1 x 0.6 cm ill-defined enhancing mass seen, which appeared to correspond to the calcification seen in the mammograms from [place], posterior and medially and upper inner left breast at 10 o'clock position a 1.2 x 1.2 x 0.6 cm enhancing mass was seen.  It was not clear whether this represented an additional area that was previously described at the [place] mammograms, as we do not have the copies of those reports.  Of note is that the patient had suspicious microcalcifications of the left nipple seen as well.  There was injected material seen in the subpectoral areas of both breasts.  The patient has been initially seen by Dr. Barry Dienes and now Dr. Brantley Stage and the plan is to have her undergo a mastectomy of the left breast with reconstruction and insertion of new implants."   Her subsequent history is as detailed below.   PAST MEDICAL HISTORY: Past Medical History:  Diagnosis Date  . Anemia   . Breast cancer ([place]) 2011  . Duodenal ulcer   . Gastritis   . GI bleed   . Hepatitis B carrier ([place]) 1998  . Hepatitis B infection   .  Peptic ulcer disease   . Personal history of chemotherapy 2011  . Personal history of radiation therapy 2011  . S/P left mastectomy 2011    PAST SURGICAL HISTORY: Past Surgical History:  Procedure Laterality Date  . left modified mastectomy  2011  . MASTECTOMY Left 2011  . PORT-A-CATH REMOVAL  02/04/2011   Procedure: REMOVAL PORT-A-CATH;  Surgeon: Joyice Faster. Cornett, MD;  Location: [place];  Service: General;  Laterality: N/A;  . PORTACATH PLACEMENT      FAMILY  HISTORY Family History  Problem Relation Age of Onset  . Hepatitis Mother   . Hepatitis B Mother     GYNECOLOGIC HISTORY: Gravida 4, para 2, Menarche age 49,  Premenopausal.  Continues to have regular periods   SOCIAL HISTORY: (Updated September 2019) Married for 20+ years, two teenaged sons age 43 (looking for a job) and 21 (in Librarian, academic)  She and her husband work in [employer], which is currently closed because of the pandemic. She enjoys working on the computer and watching TV in her spare time.    ADVANCED DIRECTIVES: In the absence of any documentation to the contrary, the patient's spouse is their HCPOA.    HEALTH MAINTENANCE: Social History   Tobacco Use  . Smoking status: Never Smoker  . Smokeless tobacco: Never Used  Substance Use Topics  . Alcohol use: No  . Drug use: No    No Known Allergies  Current Outpatient Medications  Medication Sig Dispense Refill  . Ferrous Fumarate (HEMOCYTE - 106 MG FE) 324 (106 Fe) MG TABS tablet Take 1 tablet (106 mg of iron total) by mouth daily. 30 tablet 6   No current facility-administered medications for this visit.    OBJECTIVE:   There were no vitals filed for this visit. Wt Readings from Last 3 Encounters:  12/01/18 132 lb 6.4 oz (60.1 kg)  11/25/17 134 lb 3.2 oz (60.9 kg)  11/19/16 131 lb 11.2 oz (59.7 kg)   There is no height or weight on file to calculate BMI.    LAB RESULTS:  CMP     Component Value Date/Time   NA 139 12/01/2018 1105   NA 138 11/19/2016 1343   K 3.7 12/01/2018 1105   K 3.9 11/19/2016 1343   CL 107 12/01/2018 1105   CL 107 06/16/2012 1514   CO2 26 12/01/2018 1105   CO2 25 11/19/2016 1343   GLUCOSE 91 12/01/2018 1105   GLUCOSE 90 11/19/2016 1343   GLUCOSE 103 (H) 06/16/2012 1514   BUN 9 12/01/2018 1105   BUN 10.6 11/19/2016 1343   CREATININE 0.81 12/01/2018 1105   CREATININE 0.8 11/19/2016 1343   CALCIUM 8.7 (L) 12/01/2018 1105   CALCIUM 9.7 11/19/2016 1343   PROT 7.3 12/01/2018  1105   PROT 7.4 11/19/2016 1343   ALBUMIN 3.9 12/01/2018 1105   ALBUMIN 3.7 11/19/2016 1343   AST 19 12/01/2018 1105   AST 23 11/19/2016 1343   ALT 19 12/01/2018 1105   ALT 23 11/19/2016 1343   ALKPHOS 81 12/01/2018 1105   ALKPHOS 69 11/19/2016 1343   BILITOT 0.7 12/01/2018 1105   BILITOT 0.74 11/19/2016 1343   GFRNONAA >60 12/01/2018 1105   GFRAA >60 12/01/2018 1105    No results found for: TOTALPROTELP, ALBUMINELP, A1GS, A2GS, BETS, BETA2SER, GAMS, MSPIKE, SPEI  Lab Results  Component Value Date   WBC 4.8 12/01/2018   NEUTROABS 2.6 12/01/2018   HGB 10.6 (L) 12/01/2018   HCT 34.4 (L) 12/01/2018  MCV 80.8 12/01/2018   PLT 254 12/01/2018    Lab Results  Component Value Date   LABCA2 10 02/18/2012    No components found for: GHWEXH371  No results for input(s): INR in the last 168 hours.  Lab Results  Component Value Date   LABCA2 10 02/18/2012    No results found for: IRC789  No results found for: FYB017  No results found for: PZW258  No results found for: CA2729  No components found for: HGQUANT  No results found for: CEA1 / No results found for: CEA1   No results found for: AFPTUMOR  No results found for: CHROMOGRNA  No results found for: KPAFRELGTCHN, LAMBDASER, KAPLAMBRATIO (kappa/lambda light chains)  No results found for: HGBA, HGBA2QUANT, HGBFQUANT, HGBSQUAN (Hemoglobinopathy evaluation)   Lab Results  Component Value Date   LDH 171 02/18/2012    No results found for: IRON, TIBC, IRONPCTSAT (Iron and TIBC)  No results found for: FERRITIN  Urinalysis    Component Value Date/Time   LABSPEC 1.005 04/24/2010 1413   PHURINE 6.5 04/24/2010 1413   HGBUR Large 04/24/2010 1413   BILIRUBINUR Negative 04/24/2010 1413   KETONESUR Negative 04/24/2010 1413   PROTEINUR < 30 04/24/2010 1413   NITRITE Negative 04/24/2010 1413   LEUKOCYTESUR Moderate 04/24/2010 1413    STUDIES: No results found.   ASSESSMENT: 49 y.o. BRCA negative [place], [place] Mandarin speaker:  1. Status post left mastectomy on 10/31/2009 with sentinel node biopsy for a T1b N0, stage IA, invasive ductal carcinoma of the left breast, grade 2, ER 0%, PR 0%, KIi-67 83%, HER-2/neu amplified by CISH with a ratio of 4.71. The patient had 2/3 positive lymph nodes for isolated tumor cells.  2.  S/p adjuvant Taxotere/Carboplatin/Herceptin from 01/03/2010 through 03/06/2010 x 6 cycles. Herceptin therapy was continued until 01/08/2011. Her last echocardiogram was on 03/13/2010 which showed an ejection fraction of 60-65%.  4. The patient underwent radiation therapy from 07/03/2010 through 08/17/2010.  5. Left chest postoperative seroma and left mastectomy bed with palpable fluctuance found by 05/05/2013   6. Patient has been offered genetics testing    PLAN: "Lauren Cohen" did not show for her September 14, 2019 visit.  Reminder letter sent.  Lauren Cohen, Lauren Dad, MD  09/14/19 6:24 PM Medical Oncology and Hematology [facility] [place], [place] [address] Tel. [phone]    Fax. [phone]   I, Wilburn Mylar, am acting as scribe for Dr. Virgie Cohen. Lauren Cohen.  I, Lurline Del MD, have reviewed the above documentation for accuracy and completeness, and I agree with the above.    *Total Encounter Time as defined by the Centers for Medicare and Medicaid Services includes, in addition to the face-to-face time of a patient visit (documented in the note above) non-face-to-face time: obtaining and reviewing outside history, ordering and reviewing medications, tests or procedures, care coordination (communications with other health care professionals or caregivers) and documentation in the medical record.

## 2019-09-14 ENCOUNTER — Inpatient Hospital Stay: Payer: Self-pay

## 2019-09-14 ENCOUNTER — Encounter: Payer: Self-pay | Admitting: Oncology

## 2019-09-14 ENCOUNTER — Inpatient Hospital Stay: Payer: Self-pay | Attending: Oncology | Admitting: Oncology

## 2019-09-14 DIAGNOSIS — C50812 Malignant neoplasm of overlapping sites of left female breast: Secondary | ICD-10-CM

## 2019-09-14 DIAGNOSIS — Z171 Estrogen receptor negative status [ER-]: Secondary | ICD-10-CM

## 2019-09-15 ENCOUNTER — Telehealth: Payer: Self-pay | Admitting: Oncology

## 2019-09-15 NOTE — Telephone Encounter (Signed)
Called pt per 6/15 sch message - unable to reach pt- left message for pt to call back .   interp ID : [id number]

## 2019-10-14 ENCOUNTER — Telehealth: Payer: Self-pay | Admitting: Oncology

## 2019-10-14 NOTE — Telephone Encounter (Signed)
Unable to reach pt called pt per 7/14 sch msg

## 2019-12-02 ENCOUNTER — Telehealth: Payer: Self-pay | Admitting: Oncology

## 2019-12-02 NOTE — Telephone Encounter (Signed)
R/s appt per 8/30 sch  msg - unable to reach pt . Left message for patient with appt date and time.   Interpreter ID # Pilar Plate [phone]

## 2019-12-30 ENCOUNTER — Inpatient Hospital Stay (HOSPITAL_BASED_OUTPATIENT_CLINIC_OR_DEPARTMENT_OTHER): Payer: Self-pay | Admitting: Medical

## 2019-12-30 ENCOUNTER — Inpatient Hospital Stay: Payer: Self-pay

## 2019-12-30 ENCOUNTER — Inpatient Hospital Stay: Payer: Self-pay | Admitting: Oncology

## 2019-12-30 ENCOUNTER — Other Ambulatory Visit: Payer: Self-pay

## 2019-12-30 ENCOUNTER — Inpatient Hospital Stay: Payer: Self-pay | Attending: Oncology

## 2019-12-30 VITALS — BP 125/70 | HR 57 | Temp 98.2°F | Resp 18 | Ht 65.0 in | Wt 137.4 lb

## 2019-12-30 DIAGNOSIS — N63 Unspecified lump in unspecified breast: Secondary | ICD-10-CM | POA: Insufficient documentation

## 2019-12-30 DIAGNOSIS — Z171 Estrogen receptor negative status [ER-]: Secondary | ICD-10-CM | POA: Insufficient documentation

## 2019-12-30 DIAGNOSIS — Z79899 Other long term (current) drug therapy: Secondary | ICD-10-CM | POA: Insufficient documentation

## 2019-12-30 DIAGNOSIS — Z8719 Personal history of other diseases of the digestive system: Secondary | ICD-10-CM | POA: Insufficient documentation

## 2019-12-30 DIAGNOSIS — Z9221 Personal history of antineoplastic chemotherapy: Secondary | ICD-10-CM | POA: Insufficient documentation

## 2019-12-30 DIAGNOSIS — Z9012 Acquired absence of left breast and nipple: Secondary | ICD-10-CM | POA: Insufficient documentation

## 2019-12-30 DIAGNOSIS — Z923 Personal history of irradiation: Secondary | ICD-10-CM | POA: Insufficient documentation

## 2019-12-30 DIAGNOSIS — Z8379 Family history of other diseases of the digestive system: Secondary | ICD-10-CM | POA: Insufficient documentation

## 2019-12-30 DIAGNOSIS — C50812 Malignant neoplasm of overlapping sites of left female breast: Secondary | ICD-10-CM

## 2019-12-30 DIAGNOSIS — D508 Other iron deficiency anemias: Secondary | ICD-10-CM

## 2019-12-30 DIAGNOSIS — N644 Mastodynia: Secondary | ICD-10-CM | POA: Insufficient documentation

## 2019-12-30 DIAGNOSIS — B181 Chronic viral hepatitis B without delta-agent: Secondary | ICD-10-CM | POA: Insufficient documentation

## 2019-12-30 LAB — CBC WITH DIFFERENTIAL/PLATELET
Abs Immature Granulocytes: 0.01 10*3/uL (ref 0.00–0.07)
Basophils Absolute: 0.1 10*3/uL (ref 0.0–0.1)
Basophils Relative: 1 %
Eosinophils Absolute: 0.1 10*3/uL (ref 0.0–0.5)
Eosinophils Relative: 2 %
HCT: 39.2 % (ref 36.0–46.0)
Hemoglobin: 13.2 g/dL (ref 12.0–15.0)
Immature Granulocytes: 0 %
Lymphocytes Relative: 39 %
Lymphs Abs: 2.3 10*3/uL (ref 0.7–4.0)
MCH: 30 pg (ref 26.0–34.0)
MCHC: 33.7 g/dL (ref 30.0–36.0)
MCV: 89.1 fL (ref 80.0–100.0)
Monocytes Absolute: 0.4 10*3/uL (ref 0.1–1.0)
Monocytes Relative: 6 %
Neutro Abs: 3.1 10*3/uL (ref 1.7–7.7)
Neutrophils Relative %: 52 %
Platelets: 188 10*3/uL (ref 150–400)
RBC: 4.4 MIL/uL (ref 3.87–5.11)
RDW: 12.4 % (ref 11.5–15.5)
WBC: 6 10*3/uL (ref 4.0–10.5)
nRBC: 0 % (ref 0.0–0.2)

## 2019-12-30 LAB — COMPREHENSIVE METABOLIC PANEL
ALT: 16 U/L (ref 0–44)
AST: 19 U/L (ref 15–41)
Albumin: 3.7 g/dL (ref 3.5–5.0)
Alkaline Phosphatase: 79 U/L (ref 38–126)
Anion gap: 5 (ref 5–15)
BUN: 9 mg/dL (ref 6–20)
CO2: 29 mmol/L (ref 22–32)
Calcium: 9.3 mg/dL (ref 8.9–10.3)
Chloride: 104 mmol/L (ref 98–111)
Creatinine, Ser: 0.77 mg/dL (ref 0.44–1.00)
GFR calc Af Amer: >60 mL/min (ref >60)
GFR calc non Af Amer: >60 mL/min (ref >60)
Glucose, Bld: 130 mg/dL — ABNORMAL HIGH (ref 70–99)
Potassium: 3.7 mmol/L (ref 3.5–5.1)
Sodium: 138 mmol/L (ref 135–145)
Total Bilirubin: 0.5 mg/dL (ref 0.3–1.2)
Total Protein: 7.2 g/dL (ref 6.5–8.1)

## 2019-12-31 NOTE — Progress Notes (Signed)
[facility] Progress Note   Lauren Cohen [id number] [birth year] 49 y.o.  Lauren Cohen is managed by Dr. Lurline Del  Actively treated with chemotherapy/immunotherapy/hormonal therapy: no  Next scheduled appointment with provider: to be arranged  Assessment: Plan:    Breast mass in female - Plan: Ambulatory referral to General Surgery  Malignant neoplasm of overlapping sites of left breast in female, estrogen receptor negative ([place]) - Plan: MM Digital Screening Unilat R   Fullness in the left superior chest wall at the left anterior axillary line: The patient was referred back to Dr. Brantley Stage for evaluation of this area.  ER negative malignant neoplasm of the left breast: The patient has been referred for an annual routine right mammogram on or after 08/11/2020.  She will return to clinic in 1 year for routine follow-up with Dr. Jana Hakim.  Please see After Visit Summary for patient specific instructions.  No future appointments.  Orders Placed This Encounter  Procedures  . MM Digital Screening Unilat R  . Ambulatory referral to General Surgery       Subjective:   Patient ID:  Lauren Cohen is a 49 y.o. (DOB 14-Jan-1971) female.  Chief Complaint: No chief complaint on file.   HPI Lauren CASTROGIOVANNI  is a 49 y.o. female with a diagnosis of an ER negative malignant neoplasm of the left breast.  She presents to the clinic today for annual routine follow-up.  She is seen with an interpreter today.  She reports having episodic right breast pain which is not associated with any activity or her monthly..  She reports that she is doing well overall with no acute issues of concern.  She does however inquire if an ultrasound could be done with her routine mammogram.  I attempted to explain to her that an ultrasound would be completed should there be any abnormality found on her mammogram.  She denies any changes in activity or changes in medications.  She continues to not have a primary care  provider whom she sees.   Medications: I have reviewed the patient's current medications.  Allergies: No Known Allergies  Past Medical History:  Diagnosis Date  . Anemia   . Breast cancer ([place]) 2011  . Duodenal ulcer   . Gastritis   . GI bleed   . Hepatitis B carrier ([place]) 1998  . Hepatitis B infection   . Peptic ulcer disease   . Personal history of chemotherapy 2011  . Personal history of radiation therapy 2011  . S/P left mastectomy 2011    Past Surgical History:  Procedure Laterality Date  . left modified mastectomy  2011  . MASTECTOMY Left 2011  . PORT-A-CATH REMOVAL  02/04/2011   Procedure: REMOVAL PORT-A-CATH;  Surgeon: Joyice Faster. Cornett, MD;  Location: [place];  Service: General;  Laterality: N/A;  . PORTACATH PLACEMENT      Family History  Problem Relation Age of Onset  . Hepatitis Mother   . Hepatitis B Mother     Social History   Socioeconomic History  . Marital status: Married    Spouse name: Not on file  . Number of children: 2  . Years of education: Not on file  . Highest education level: Not on file  Occupational History  . Not on file  Tobacco Use  . Smoking status: Never Smoker  . Smokeless tobacco: Never Used  Substance and Sexual Activity  . Alcohol use: No  . Drug use: No  . Sexual activity:  Yes    Birth control/protection: Inserts  Other Topics Concern  . Not on file  Social History Narrative  . Not on file   Social Determinants of Health   Financial Resource Strain:   . Difficulty of Paying Living Expenses: Not on file  Food Insecurity:   . Worried About Charity fundraiser in the Last Year: Not on file  . Ran Out of Food in the Last Year: Not on file  Transportation Needs:   . Lack of Transportation (Medical): Not on file  . Lack of Transportation (Non-Medical): Not on file  Physical Activity:   . Days of Exercise per Week: Not on file  . Minutes of Exercise per Session: Not on file  Stress:   . Feeling of Stress : Not on  file  Social Connections:   . Frequency of Communication with Friends and Family: Not on file  . Frequency of Social Gatherings with Friends and Family: Not on file  . Attends Religious Services: Not on file  . Active Member of Clubs or Organizations: Not on file  . Attends Archivist Meetings: Not on file  . Marital Status: Not on file  Intimate Partner Violence:   . Fear of Current or Ex-Partner: Not on file  . Emotionally Abused: Not on file  . Physically Abused: Not on file  . Sexually Abused: Not on file    Past Medical History, Surgical history, Social history, and Family history were reviewed and updated as appropriate.   Please see review of systems for further details on the patient's review from today.   Review of Systems:  Review of Systems  Constitutional: Negative for activity change, chills, diaphoresis and fever.  HENT: Negative for trouble swallowing and voice change.   Respiratory: Negative for cough, chest tightness, shortness of breath and wheezing.   Cardiovascular: Negative for chest pain and palpitations.  Gastrointestinal: Negative for abdominal pain, constipation, diarrhea, nausea and vomiting.  Musculoskeletal: Negative for back pain and myalgias.  Skin: Negative for color change, rash and wound.       Episodic right breast pain.  Neurological: Negative for dizziness, light-headedness and headaches.    Objective:   Physical Exam:  BP 125/70 (BP Location: Right Arm, Patient Position: Sitting)   Pulse (!) 57   Temp 98.2 F (36.8 C) (Tympanic)   Resp 18   Ht 5\' 5"  (1.651 m)   Wt 137 lb 6.4 oz (62.3 kg)   SpO2 100%   BMI 22.86 kg/m  ECOG: 0  Physical Exam Exam conducted with a chaperone present.  Constitutional:      General: She is not in acute distress.    Appearance: She is not diaphoretic.  HENT:     Head: Normocephalic and atraumatic.  Cardiovascular:     Rate and Rhythm: Normal rate and regular rhythm.     Heart sounds:  Normal heart sounds. No murmur heard.  No friction rub. No gallop.   Pulmonary:     Effort: Pulmonary effort is normal. No respiratory distress.     Breath sounds: Normal breath sounds. No stridor. No wheezing or rales.  Chest:     Breasts:        Right: Tenderness (Mild right breast tenderness with palpation.) present. No swelling, bleeding, inverted nipple, mass, nipple discharge or skin change.        Left: Absent.    Musculoskeletal:     Cervical back: Normal range of motion and neck supple.  Lymphadenopathy:  Head:     Right side of head: No submandibular, tonsillar, preauricular, posterior auricular or occipital adenopathy.     Left side of head: No submandibular, tonsillar, preauricular, posterior auricular or occipital adenopathy.     Cervical: No cervical adenopathy.     Upper Body:     Right upper body: No supraclavicular, axillary or pectoral adenopathy.     Left upper body: No supraclavicular, axillary or pectoral adenopathy.     Lower Body: No right inguinal adenopathy. No left inguinal adenopathy.  Skin:    General: Skin is warm and dry.     Findings: No erythema or rash.  Neurological:     Mental Status: She is alert.     Coordination: Coordination normal.  Psychiatric:        Behavior: Behavior normal.        Thought Content: Thought content normal.        Judgment: Judgment normal.       Lab Review:     Component Value Date/Time   NA 138 12/30/2019 1433   NA 138 11/19/2016 1343   K 3.7 12/30/2019 1433   K 3.9 11/19/2016 1343   CL 104 12/30/2019 1433   CL 107 06/16/2012 1514   CO2 29 12/30/2019 1433   CO2 25 11/19/2016 1343   GLUCOSE 130 (H) 12/30/2019 1433   GLUCOSE 90 11/19/2016 1343   GLUCOSE 103 (H) 06/16/2012 1514   BUN 9 12/30/2019 1433   BUN 10.6 11/19/2016 1343   CREATININE 0.77 12/30/2019 1433   CREATININE 0.81 12/01/2018 1105   CREATININE 0.8 11/19/2016 1343   CALCIUM 9.3 12/30/2019 1433   CALCIUM 9.7 11/19/2016 1343   PROT 7.2  12/30/2019 1433   PROT 7.4 11/19/2016 1343   ALBUMIN 3.7 12/30/2019 1433   ALBUMIN 3.7 11/19/2016 1343   AST 19 12/30/2019 1433   AST 19 12/01/2018 1105   AST 23 11/19/2016 1343   ALT 16 12/30/2019 1433   ALT 19 12/01/2018 1105   ALT 23 11/19/2016 1343   ALKPHOS 79 12/30/2019 1433   ALKPHOS 69 11/19/2016 1343   BILITOT 0.5 12/30/2019 1433   BILITOT 0.7 12/01/2018 1105   BILITOT 0.74 11/19/2016 1343   GFRNONAA >60 12/30/2019 1433   GFRNONAA >60 12/01/2018 1105   GFRAA >60 12/30/2019 1433   GFRAA >60 12/01/2018 1105       Component Value Date/Time   WBC 6.0 12/30/2019 1433   RBC 4.40 12/30/2019 1433   HGB 13.2 12/30/2019 1433   HGB 10.6 (L) 12/01/2018 1105   HGB 11.4 (L) 11/19/2016 1343   HCT 39.2 12/30/2019 1433   HCT 35.2 11/19/2016 1343   PLT 188 12/30/2019 1433   PLT 254 12/01/2018 1105   PLT 221 11/19/2016 1343   MCV 89.1 12/30/2019 1433   MCV 81.6 11/19/2016 1343   MCH 30.0 12/30/2019 1433   MCHC 33.7 12/30/2019 1433   RDW 12.4 12/30/2019 1433   RDW 15.1 (H) 11/19/2016 1343   LYMPHSABS 2.3 12/30/2019 1433   LYMPHSABS 1.8 11/19/2016 1343   MONOABS 0.4 12/30/2019 1433   MONOABS 0.5 11/19/2016 1343   EOSABS 0.1 12/30/2019 1433   EOSABS 0.1 11/19/2016 1343   BASOSABS 0.1 12/30/2019 1433   BASOSABS 0.0 11/19/2016 1343   -------------------------------  Imaging from last 24 hours (if applicable):  Radiology interpretation: No results found.

## 2020-10-20 ENCOUNTER — Other Ambulatory Visit: Payer: Self-pay | Admitting: Oncology

## 2020-10-20 DIAGNOSIS — C50812 Malignant neoplasm of overlapping sites of left female breast: Secondary | ICD-10-CM

## 2020-12-12 ENCOUNTER — Other Ambulatory Visit: Payer: Self-pay

## 2020-12-12 ENCOUNTER — Ambulatory Visit: Payer: Self-pay

## 2020-12-31 NOTE — Progress Notes (Signed)
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  However she did not show  Her most recent right screening mammography with tomography at [facility] on 08/11/2019 showing: breast density category C; no evidence of malignancy.   REVIEW OF SYSTEMS: Mikeal Hawthorne    COVID 19 VACCINATION STATUS:    HISTORY OF PRESENT ILLNESS: From Dr. [name]'s new patient evaluation dated 09/13/2009:  "This is a pleasant 50 year old woman of Chinese extraction here today with an interpreter for evaluation and discussion of recent diagnosis of breast cancer. 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  She apparently has had a problem with a nipple discharge for a period of a year.  She had imaging studies at [facility] in [place] and subsequently had an FNA done at [facility].  Per history, she has been having nipple discharge for intermittently over a year.  She had implants placed as well.  Prior to that, the FNA reports suggested the presence of normal calcifications in ducts at the 11 o'clock position of the left breast 2.0 cm from the nipple.  She appeared to also have an abnormality adjacent to that.  On the 4th of April, she had an FNA of the abnormal area at 17 o' clock, 2.0 cm from the nipple, one smear was diagnostic and showed atypical cells consistent with a diagnosis of adenocarcinoma. There was an  additional aspirate, which showed fibrocystic changes.  The patient returned and wished to have her further workup in [place].  She subsequently had another mammogram pre-MRI done on 08/08/2009, which showed subpectoral implants.  This plate showed heterogeneous dense breast tissue and no dominant masses were seen.  The MRI scan from 08/08/2009 showed that in the upper inner subarea of the left breast at 9:30 is a 1.2 x 1.1 x 0.6 cm ill-defined enhancing mass seen, which appeared to correspond to the calcification seen in the mammograms from [place], posterior and medially and upper inner left breast at 10 o'clock position a 1.2 x 1.2 x 0.6 cm enhancing mass was seen.  It was not clear whether this represented an additional area that was previously described at the [place] mammograms, as we do not have the copies of those reports.  Of note is that the patient had suspicious microcalcifications of the left nipple seen as well.  There was injected material seen in the subpectoral areas of both breasts.  The patient has been initially seen by Dr. Barry Dienes and now Dr. Brantley Stage and the plan is to have her undergo a mastectomy of the left breast with reconstruction and insertion of new implants."   Her subsequent history is as detailed below.   PAST MEDICAL HISTORY: Past Medical History:  Diagnosis Date   Anemia    Breast cancer ([place]) 2011   Duodenal ulcer    Gastritis    GI bleed    Hepatitis B carrier ([place]) 1998  Hepatitis B infection    Peptic ulcer disease    Personal history of chemotherapy 2011   Personal history of radiation therapy 2011   S/P left mastectomy 2011    PAST SURGICAL HISTORY: Past Surgical History:  Procedure Laterality Date   left modified mastectomy  2011   MASTECTOMY Left 2011   PORT-A-CATH REMOVAL  02/04/2011   Procedure: REMOVAL PORT-A-CATH;  Surgeon: Joyice Faster. Cornett, MD;  Location: [facility] OR;  Service: General;  Laterality: N/A;   PORTACATH PLACEMENT      FAMILY  HISTORY Family History  Problem Relation Age of Onset   Hepatitis Mother    Hepatitis B Mother     GYNECOLOGIC HISTORY: Gravida 4, para 2, Menarche age 50,  Premenopausal.  Continues to have regular periods   SOCIAL HISTORY: (Updated September 2019) Married for 20+ years, two teenaged sons age 4 (looking for a job) and 21 (in Librarian, academic)  She and her husband work in [employer], which is currently closed because of the pandemic. She enjoys working on the computer and watching TV in her spare time.    ADVANCED DIRECTIVES: In the absence of any documentation to the contrary, the patient's spouse is their HCPOA.    HEALTH MAINTENANCE: Social History   Tobacco Use   Smoking status: Never   Smokeless tobacco: Never  Substance Use Topics   Alcohol use: No   Drug use: No    No Known Allergies  Current Outpatient Medications  Medication Sig Dispense Refill   Ferrous Fumarate (HEMOCYTE - 106 MG FE) 324 (106 Fe) MG TABS tablet Take 1 tablet (106 mg of iron total) by mouth daily. 30 tablet 6   No current facility-administered medications for this visit.    OBJECTIVE:   There were no vitals filed for this visit. Wt Readings from Last 3 Encounters:  12/30/19 137 lb 6.4 oz (62.3 kg)  12/01/18 132 lb 6.4 oz (60.1 kg)  11/25/17 134 lb 3.2 oz (60.9 kg)   There is no height or weight on file to calculate BMI.       LAB RESULTS:  CMP     Component Value Date/Time   NA 138 12/30/2019 1433   NA 138 11/19/2016 1343   K 3.7 12/30/2019 1433   K 3.9 11/19/2016 1343   CL 104 12/30/2019 1433   CL 107 06/16/2012 1514   CO2 29 12/30/2019 1433   CO2 25 11/19/2016 1343   GLUCOSE 130 (H) 12/30/2019 1433   GLUCOSE 90 11/19/2016 1343   GLUCOSE 103 (H) 06/16/2012 1514   BUN 9 12/30/2019 1433   BUN 10.6 11/19/2016 1343   CREATININE 0.77 12/30/2019 1433   CREATININE 0.81 12/01/2018 1105   CREATININE 0.8 11/19/2016 1343   CALCIUM 9.3 12/30/2019 1433   CALCIUM 9.7 11/19/2016 1343    PROT 7.2 12/30/2019 1433   PROT 7.4 11/19/2016 1343   ALBUMIN 3.7 12/30/2019 1433   ALBUMIN 3.7 11/19/2016 1343   AST 19 12/30/2019 1433   AST 19 12/01/2018 1105   AST 23 11/19/2016 1343   ALT 16 12/30/2019 1433   ALT 19 12/01/2018 1105   ALT 23 11/19/2016 1343   ALKPHOS 79 12/30/2019 1433   ALKPHOS 69 11/19/2016 1343   BILITOT 0.5 12/30/2019 1433   BILITOT 0.7 12/01/2018 1105   BILITOT 0.74 11/19/2016 1343   GFRNONAA >60 12/30/2019 1433   GFRNONAA >60 12/01/2018 1105   GFRAA >60 12/30/2019 1433   GFRAA >60 12/01/2018 1105    No  results found for: TOTALPROTELP, ALBUMINELP, A1GS, A2GS, BETS, BETA2SER, GAMS, MSPIKE, SPEI  Lab Results  Component Value Date   WBC 6.0 12/30/2019   NEUTROABS 3.1 12/30/2019   HGB 13.2 12/30/2019   HCT 39.2 12/30/2019   MCV 89.1 12/30/2019   PLT 188 12/30/2019    Lab Results  Component Value Date   LABCA2 10 02/18/2012    No components found for: [id number]  No results for input(s): INR in the last 168 hours.  Lab Results  Component Value Date   LABCA2 10 02/18/2012    No results found for: WLK957  No results found for: MBB403  No results found for: JQD643  No results found for: CA2729  No components found for: HGQUANT  No results found for: CEA1 / No results found for: CEA1   No results found for: AFPTUMOR  No results found for: CHROMOGRNA  No results found for: KPAFRELGTCHN, LAMBDASER, KAPLAMBRATIO (kappa/lambda light chains)  No results found for: HGBA, HGBA2QUANT, HGBFQUANT, HGBSQUAN (Hemoglobinopathy evaluation)   Lab Results  Component Value Date   LDH 171 02/18/2012    No results found for: IRON, TIBC, IRONPCTSAT (Iron and TIBC)  No results found for: FERRITIN  Urinalysis    Component Value Date/Time   LABSPEC 1.005 04/24/2010 1413   PHURINE 6.5 04/24/2010 1413   HGBUR Large 04/24/2010 1413   BILIRUBINUR Negative 04/24/2010 1413   KETONESUR Negative 04/24/2010 1413   PROTEINUR < 30 04/24/2010 1413    NITRITE Negative 04/24/2010 1413   LEUKOCYTESUR Moderate 04/24/2010 1413    STUDIES: No results found.   ASSESSMENT: 50 y.o. BRCA negative [place], [place] Mandarin speaker:  1. Status post left mastectomy on 10/31/2009 with sentinel node biopsy for a T1b N0, stage IA, invasive ductal carcinoma of the left breast, grade 2, ER 0%, PR 0%, KIi-67 83%, HER-2/neu amplified by CISH with a ratio of 4.71. The patient had 2/3 positive lymph nodes for isolated tumor cells.  2.  S/p adjuvant Taxotere/Carboplatin/Herceptin from 01/03/2010 through 03/06/2010 x 6 cycles. Herceptin therapy was continued until 01/08/2011. Her last echocardiogram was on 03/13/2010 which showed an ejection fraction of 60-65%.  4. The patient underwent radiation therapy from 07/03/2010 through 08/17/2010.  5. Left chest postoperative seroma and left mastectomy bed with palpable fluctuance found by 05/05/2013   6. Patient has been offered genetics testing    PLAN: "Leafy Ro" did not show for her 01/01/2021 visit.  A follow-up letter was   Khalid Lacko, Virgie Dad, MD  12/31/20 8:46 PM Medical Oncology and Hematology [facility] [place], [place] [address] Tel. [phone]    Fax. [phone]   I, Wilburn Mylar, am acting as scribe for Dr. Virgie Dad. Valene Villa.  I, Lurline Del MD, have reviewed the above documentation for accuracy and completeness, and I agree with the above.   *Total Encounter Time as defined by the Centers for Medicare and Medicaid Services includes, in addition to the face-to-face time of a patient visit (documented in the note above) non-face-to-face time: obtaining and reviewing outside history, ordering and reviewing medications, tests or procedures, care coordination (communications with other health care professionals or caregivers) and documentation in the medical record.

## 2021-01-01 ENCOUNTER — Encounter: Payer: Self-pay | Admitting: Oncology

## 2021-01-01 ENCOUNTER — Inpatient Hospital Stay: Payer: Self-pay | Attending: Oncology

## 2021-01-01 ENCOUNTER — Inpatient Hospital Stay (HOSPITAL_BASED_OUTPATIENT_CLINIC_OR_DEPARTMENT_OTHER): Payer: Self-pay | Admitting: Oncology

## 2021-01-01 DIAGNOSIS — C50812 Malignant neoplasm of overlapping sites of left female breast: Secondary | ICD-10-CM

## 2021-01-01 DIAGNOSIS — Z171 Estrogen receptor negative status [ER-]: Secondary | ICD-10-CM

## 2021-11-26 ENCOUNTER — Other Ambulatory Visit: Payer: Self-pay | Admitting: *Deleted

## 2021-11-26 DIAGNOSIS — C50912 Malignant neoplasm of unspecified site of left female breast: Secondary | ICD-10-CM

## 2021-11-27 ENCOUNTER — Inpatient Hospital Stay: Payer: No Typology Code available for payment source

## 2021-11-27 ENCOUNTER — Inpatient Hospital Stay: Payer: No Typology Code available for payment source | Attending: Hematology and Oncology

## 2021-11-27 ENCOUNTER — Other Ambulatory Visit: Payer: Self-pay

## 2021-11-27 ENCOUNTER — Inpatient Hospital Stay (HOSPITAL_BASED_OUTPATIENT_CLINIC_OR_DEPARTMENT_OTHER): Payer: No Typology Code available for payment source | Admitting: Hematology and Oncology

## 2021-11-27 ENCOUNTER — Encounter: Payer: Self-pay | Admitting: Hematology and Oncology

## 2021-11-27 VITALS — BP 113/52 | HR 66 | Temp 97.9°F | Resp 18 | Ht 65.0 in | Wt 130.5 lb

## 2021-11-27 DIAGNOSIS — Z923 Personal history of irradiation: Secondary | ICD-10-CM | POA: Insufficient documentation

## 2021-11-27 DIAGNOSIS — Z170 Estrogen receptor positive status [ER+]: Secondary | ICD-10-CM | POA: Insufficient documentation

## 2021-11-27 DIAGNOSIS — C50212 Malignant neoplasm of upper-inner quadrant of left female breast: Secondary | ICD-10-CM | POA: Insufficient documentation

## 2021-11-27 DIAGNOSIS — E876 Hypokalemia: Secondary | ICD-10-CM | POA: Insufficient documentation

## 2021-11-27 DIAGNOSIS — Z9012 Acquired absence of left breast and nipple: Secondary | ICD-10-CM | POA: Insufficient documentation

## 2021-11-27 DIAGNOSIS — Z9221 Personal history of antineoplastic chemotherapy: Secondary | ICD-10-CM | POA: Diagnosis not present

## 2021-11-27 DIAGNOSIS — Z8719 Personal history of other diseases of the digestive system: Secondary | ICD-10-CM | POA: Diagnosis not present

## 2021-11-27 DIAGNOSIS — Z79899 Other long term (current) drug therapy: Secondary | ICD-10-CM | POA: Insufficient documentation

## 2021-11-27 DIAGNOSIS — C50912 Malignant neoplasm of unspecified site of left female breast: Secondary | ICD-10-CM | POA: Diagnosis not present

## 2021-11-27 DIAGNOSIS — N6332 Unspecified lump in axillary tail of the left breast: Secondary | ICD-10-CM | POA: Diagnosis not present

## 2021-11-27 LAB — CBC WITH DIFFERENTIAL (CANCER CENTER ONLY)
Abs Immature Granulocytes: 0.01 10*3/uL (ref 0.00–0.07)
Basophils Absolute: 0.1 10*3/uL (ref 0.0–0.1)
Basophils Relative: 1 %
Eosinophils Absolute: 0.1 10*3/uL (ref 0.0–0.5)
Eosinophils Relative: 1 %
HCT: 34.3 % — ABNORMAL LOW (ref 36.0–46.0)
Hemoglobin: 11.5 g/dL — ABNORMAL LOW (ref 12.0–15.0)
Immature Granulocytes: 0 %
Lymphocytes Relative: 26 %
Lymphs Abs: 1.3 10*3/uL (ref 0.7–4.0)
MCH: 30.2 pg (ref 26.0–34.0)
MCHC: 33.5 g/dL (ref 30.0–36.0)
MCV: 90 fL (ref 80.0–100.0)
Monocytes Absolute: 0.3 10*3/uL (ref 0.1–1.0)
Monocytes Relative: 6 %
Neutro Abs: 3.4 10*3/uL (ref 1.7–7.7)
Neutrophils Relative %: 66 %
Platelet Count: 259 10*3/uL (ref 150–400)
RBC: 3.81 MIL/uL — ABNORMAL LOW (ref 3.87–5.11)
RDW: 13.3 % (ref 11.5–15.5)
WBC Count: 5.1 10*3/uL (ref 4.0–10.5)
nRBC: 0 % (ref 0.0–0.2)

## 2021-11-27 LAB — CMP (CANCER CENTER ONLY)
ALT: 17 U/L (ref 0–44)
AST: 14 U/L — ABNORMAL LOW (ref 15–41)
Albumin: 3.6 g/dL (ref 3.5–5.0)
Alkaline Phosphatase: 82 U/L (ref 38–126)
Anion gap: 2 — ABNORMAL LOW (ref 5–15)
BUN: 13 mg/dL (ref 6–20)
CO2: 29 mmol/L (ref 22–32)
Calcium: 8.6 mg/dL — ABNORMAL LOW (ref 8.9–10.3)
Chloride: 109 mmol/L (ref 98–111)
Creatinine: 0.61 mg/dL (ref 0.44–1.00)
GFR, Estimated: >60 mL/min (ref >60)
Glucose, Bld: 109 mg/dL — ABNORMAL HIGH (ref 70–99)
Potassium: 3.3 mmol/L — ABNORMAL LOW (ref 3.5–5.1)
Sodium: 140 mmol/L (ref 135–145)
Total Bilirubin: 0.5 mg/dL (ref 0.3–1.2)
Total Protein: 6.4 g/dL — ABNORMAL LOW (ref 6.5–8.1)

## 2021-11-27 NOTE — Progress Notes (Signed)
Lauren Cohen  Telephone:([phone] Fax:(336) [phone]  OFFICE PROGRESS NOTE   [name]   DOB: 02-19-1971  MR#: [id number]  YSA#:[id number]  Patient Care Team: Patient, No Pcp Per as PCP - General (General Practice) OTHER MD: Ileene Patrick, MD   CHIEF COMPLAINT: HER-2 positive breast cancer (s/p left mastectomy)  CURRENT TREATMENT: Observation   INTERVAL HISTORY:  Lauren Cohen was scheduled today for follow-up of her HER-2 positive breast cancer.  She arrived today with a mandrin interpreter.  She missed her last appointment with Dr. Jana Hakim.  She had several questions today.  She reports some left elbow pain which started in the past week or 2.  No trauma reported.  She has also felt some nodules in the left axillary tail region for the past year but has not called or reported to anyone.  She does not have a PCP.  She otherwise denies any health issues.  She denies any change in breathing, bowel habits or urinary habits.  Rest of the pertinent 10 point ROS reviewed and negative.  COVID 19 VACCINATION STATUS:    HISTORY OF PRESENT ILLNESS: From Dr. [name]'s new patient evaluation dated 09/13/2009:  "This is a pleasant 51 year old woman of Chinese extraction here today with an interpreter for evaluation and discussion of recent diagnosis of breast cancer. 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  She apparently has had a problem with a nipple discharge for a period of a year.  She had imaging studies at [facility] in [place] and subsequently had an FNA done at [facility].  Per history, she has been having nipple discharge for intermittently over a year.  She had implants placed as well.  Prior to that, the FNA reports suggested the presence of normal calcifications in ducts at the 11 o'clock  position of the left breast 2.0 cm from the nipple.  She appeared to also have an abnormality adjacent to that.  On the 4th of April, she had an FNA of the abnormal area at 69 o' clock, 2.0 cm from the nipple, one smear was diagnostic and showed atypical cells consistent with a diagnosis of adenocarcinoma. There was an additional aspirate, which showed fibrocystic changes.  The patient returned and wished to have her further workup in [place].  She subsequently had another mammogram pre-MRI done on 08/08/2009, which showed subpectoral implants.  This plate showed heterogeneous dense breast tissue and no dominant masses were seen.  The MRI scan from 08/08/2009 showed that in the upper inner subarea of the left breast at 9:30 is a 1.2 x 1.1 x 0.6 cm ill-defined enhancing mass seen, which appeared to correspond to the calcification seen in the mammograms from [place], posterior and medially and upper inner left breast at 10 o'clock position a 1.2 x 1.2 x 0.6 cm enhancing mass was seen.  It was not clear whether this represented an additional area that was previously described at the [place] mammograms, as we do not have the copies of those reports.  Of note is that the patient had suspicious microcalcifications of the left nipple seen as well.  There was injected material seen in the subpectoral areas of both breasts.  The patient has been initially seen by Dr. Barry Dienes and now Dr. Brantley Stage and the plan is to have her  undergo a mastectomy of the left breast with reconstruction and insertion of new implants."   Her subsequent history is as detailed below.   PAST MEDICAL HISTORY: Past Medical History:  Diagnosis Date   Anemia    Breast cancer ([place]) 2011   Duodenal ulcer    Gastritis    GI bleed    Hepatitis B carrier ([place]) 1998   Hepatitis B infection    Peptic ulcer disease    Personal history of chemotherapy 2011   Personal history of radiation therapy 2011   S/P left mastectomy 2011    PAST  SURGICAL HISTORY: Past Surgical History:  Procedure Laterality Date   left modified mastectomy  2011   MASTECTOMY Left 2011   PORT-A-CATH REMOVAL  02/04/2011   Procedure: REMOVAL PORT-A-CATH;  Surgeon: Joyice Faster. Cornett, MD;  Location: [facility] OR;  Service: General;  Laterality: N/A;   PORTACATH PLACEMENT      FAMILY HISTORY Family History  Problem Relation Age of Onset   Hepatitis Mother    Hepatitis B Mother     GYNECOLOGIC HISTORY: Gravida 4, para 2, Menarche age 4,  Premenopausal.  Continues to have regular periods   SOCIAL HISTORY: (Updated September 2019) Married for 20+ years, two teenaged sons age 50 (looking for a job) and 21 (in Librarian, academic)  She and her husband work in [employer], which is currently closed because of the pandemic. She enjoys working on the computer and watching TV in her spare time.    ADVANCED DIRECTIVES: In the absence of any documentation to the contrary, the patient's spouse is their HCPOA.    HEALTH MAINTENANCE: Social History   Tobacco Use   Smoking status: Never   Smokeless tobacco: Never  Substance Use Topics   Alcohol use: No   Drug use: No    No Known Allergies  Current Outpatient Medications  Medication Sig Dispense Refill   Ferrous Fumarate (HEMOCYTE - 106 MG FE) 324 (106 Fe) MG TABS tablet Take 1 tablet (106 mg of iron total) by mouth daily. 30 tablet 6   No current facility-administered medications for this visit.    OBJECTIVE:   Vitals:   11/27/21 1533  BP: (!) 113/52  Pulse: 66  Resp: 18  Temp: 97.9 F (36.6 C)  SpO2: 99%   Wt Readings from Last 3 Encounters:  11/27/21 130 lb 8 oz (59.2 kg)  12/30/19 137 lb 6.4 oz (62.3 kg)  12/01/18 132 lb 6.4 oz (60.1 kg)   Body mass index is 21.72 kg/m.    Physical Exam Constitutional:      Appearance: Normal appearance.  Chest:     Comments: She has bilateral implants.  Right implant appears to be intact.  Left implant appears to be distorted and not in place.  There is  also palpable lump in the left axillary tail region .  She believes it may have been there for more than a year. Musculoskeletal:     Cervical back: Normal range of motion and neck supple. No rigidity.  Lymphadenopathy:     Cervical: No cervical adenopathy.  Neurological:     Mental Status: She is alert.       LAB RESULTS:  CMP     Component Value Date/Time   NA 138 12/30/2019 1433   NA 138 11/19/2016 1343   K 3.7 12/30/2019 1433   K 3.9 11/19/2016 1343   CL 104 12/30/2019 1433   CL 107 06/16/2012 1514   CO2 29 12/30/2019 1433  CO2 25 11/19/2016 1343   GLUCOSE 130 (H) 12/30/2019 1433   GLUCOSE 90 11/19/2016 1343   GLUCOSE 103 (H) 06/16/2012 1514   BUN 9 12/30/2019 1433   BUN 10.6 11/19/2016 1343   CREATININE 0.77 12/30/2019 1433   CREATININE 0.81 12/01/2018 1105   CREATININE 0.8 11/19/2016 1343   CALCIUM 9.3 12/30/2019 1433   CALCIUM 9.7 11/19/2016 1343   PROT 7.2 12/30/2019 1433   PROT 7.4 11/19/2016 1343   ALBUMIN 3.7 12/30/2019 1433   ALBUMIN 3.7 11/19/2016 1343   AST 19 12/30/2019 1433   AST 19 12/01/2018 1105   AST 23 11/19/2016 1343   ALT 16 12/30/2019 1433   ALT 19 12/01/2018 1105   ALT 23 11/19/2016 1343   ALKPHOS 79 12/30/2019 1433   ALKPHOS 69 11/19/2016 1343   BILITOT 0.5 12/30/2019 1433   BILITOT 0.7 12/01/2018 1105   BILITOT 0.74 11/19/2016 1343   GFRNONAA >60 12/30/2019 1433   GFRNONAA >60 12/01/2018 1105   GFRAA >60 12/30/2019 1433   GFRAA >60 12/01/2018 1105    No results found for: "TOTALPROTELP", "ALBUMINELP", "A1GS", "A2GS", "BETS", "BETA2SER", "GAMS", "MSPIKE", "SPEI"  Lab Results  Component Value Date   WBC 5.1 11/27/2021   NEUTROABS 3.4 11/27/2021   HGB 11.5 (L) 11/27/2021   HCT 34.3 (L) 11/27/2021   MCV 90.0 11/27/2021   PLT 259 11/27/2021    Lab Results  Component Value Date   LABCA2 10 02/18/2012    No components found for: "QZRAQT622"  No results for input(s): "INR" in the last 168 hours.  Lab Results  Component  Value Date   LABCA2 10 02/18/2012    No results found for: "CAN199"  No results found for: "CAN125"  No results found for: "CAN153"  No results found for: "CA2729"  No components found for: "HGQUANT"  No results found for: "CEA1", "CEA" / No results found for: "CEA1", "CEA"   No results found for: "AFPTUMOR"  No results found for: "CHROMOGRNA"  No results found for: "KPAFRELGTCHN", "LAMBDASER", "KAPLAMBRATIO" (kappa/lambda light chains)  No results found for: "HGBA", "HGBA2QUANT", "HGBFQUANT", "HGBSQUAN" (Hemoglobinopathy evaluation)   Lab Results  Component Value Date   LDH 171 02/18/2012    No results found for: "IRON", "TIBC", "IRONPCTSAT" (Iron and TIBC)  No results found for: "FERRITIN"  Urinalysis    Component Value Date/Time   LABSPEC 1.005 04/24/2010 1413   PHURINE 6.5 04/24/2010 1413   HGBUR Large 04/24/2010 1413   BILIRUBINUR Negative 04/24/2010 1413   KETONESUR Negative 04/24/2010 1413   PROTEINUR < 30 04/24/2010 1413   NITRITE Negative 04/24/2010 1413   LEUKOCYTESUR Moderate 04/24/2010 1413    STUDIES: No results found.   ASSESSMENT: 51 y.o. BRCA negative [place], [place] Mandarin speaker:  1. Status post left mastectomy on 10/31/2009 with sentinel node biopsy for a T1b N0, stage IA, invasive ductal carcinoma of the left breast, grade 2, ER 0%, PR 0%, KIi-67 83%, HER-2/neu amplified by CISH with a ratio of 4.71. The patient had 2/3 positive lymph nodes for isolated tumor cells.  2.  S/p adjuvant Taxotere/Carboplatin/Herceptin from 01/03/2010 through 03/06/2010 x 6 cycles. Herceptin therapy was continued until 01/08/2011. Her last echocardiogram was on 03/13/2010 which showed an ejection fraction of 60-65%.  4. The patient underwent radiation therapy from 07/03/2010 through 08/17/2010.  5. Left chest postoperative seroma and left mastectomy bed with palpable fluctuance found by 05/05/2013   6. Patient has been offered genetics  testing    PLAN: Patient is here  for follow-up.  Since last visit she reports some left elbow pain as well as some nodularity in the left chest wall near the axillary tail.  She otherwise denies any systemic symptoms.  Physical examination, palpable lumps in the left axillary tail, consistency is not quite characteristic of pathological lymphadenopathy.  This could also be an area of fat necrosis however will require further investigation hence have ordered an ultrasound.  She is also overdue for a right breast mammogram hence this has been ordered.  We have recommended follow-up in 4 weeks to review the imaging results and to discuss any additional recommendations.  If she does not have any evidence of pathological findings, since she is more than 10 years out from her diagnosis, she will be discharged to our survivorship clinic. She denies any bone pains except for the right elbow pain.  Once again if the mammogram and ultrasound are unremarkable, I do not believe her right arm pain is related to her prior diagnosis of breast cancer.  She should establish with a PCP for routine health visits. Labs from today show mild hypokalemia and mild anemia.  Overall stable Total time spent: 40 minutes  *Total Encounter Time as defined by the Centers for Medicare and Medicaid Services includes, in addition to the face-to-face time of a patient visit (documented in the note above) non-face-to-face time: obtaining and reviewing outside history, ordering and reviewing medications, tests or procedures, care coordination (communications with other health care professionals or caregivers) and documentation in the medical record.

## 2021-11-28 ENCOUNTER — Telehealth: Payer: Self-pay | Admitting: *Deleted

## 2021-11-28 LAB — HCG, SERUM, QUALITATIVE: Preg, Serum: NEGATIVE

## 2021-12-11 ENCOUNTER — Other Ambulatory Visit: Payer: Self-pay | Admitting: *Deleted

## 2021-12-11 DIAGNOSIS — Z171 Estrogen receptor negative status [ER-]: Secondary | ICD-10-CM

## 2021-12-11 NOTE — Telephone Encounter (Signed)
Referral placed to [facility] for consultation.  Called above to follow up on receiving referral and obtained a VM- detailed message left with request for a return call to verify referral received and if any additional information needed.  This RN's name and direct call back number given.

## 2021-12-26 ENCOUNTER — Ambulatory Visit: Payer: No Typology Code available for payment source | Admitting: Hematology and Oncology

## 2022-01-29 ENCOUNTER — Encounter: Payer: Self-pay | Admitting: Hematology and Oncology

## 2023-01-09 ENCOUNTER — Ambulatory Visit
Admission: RE | Admit: 2023-01-09 | Discharge: 2023-01-09 | Disposition: A | Discharge status: Home / Self Care | Payer: No Typology Code available for payment source | Source: Ambulatory Visit | Attending: Hematology and Oncology | Admitting: Hematology and Oncology

## 2023-01-09 ENCOUNTER — Other Ambulatory Visit: Payer: Self-pay

## 2023-01-09 ENCOUNTER — Inpatient Hospital Stay
Payer: No Typology Code available for payment source | Attending: Hematology and Oncology | Admitting: Hematology and Oncology

## 2023-01-09 VITALS — BP 111/67 | HR 67 | Temp 98.1°F | Resp 17 | Wt 133.7 lb

## 2023-01-09 DIAGNOSIS — Z9221 Personal history of antineoplastic chemotherapy: Secondary | ICD-10-CM | POA: Insufficient documentation

## 2023-01-09 DIAGNOSIS — Z9012 Acquired absence of left breast and nipple: Secondary | ICD-10-CM | POA: Insufficient documentation

## 2023-01-09 DIAGNOSIS — N6322 Unspecified lump in the left breast, upper inner quadrant: Secondary | ICD-10-CM | POA: Insufficient documentation

## 2023-01-09 DIAGNOSIS — Z8616 Personal history of COVID-19: Secondary | ICD-10-CM | POA: Insufficient documentation

## 2023-01-09 DIAGNOSIS — C50912 Malignant neoplasm of unspecified site of left female breast: Secondary | ICD-10-CM

## 2023-01-09 DIAGNOSIS — Z79899 Other long term (current) drug therapy: Secondary | ICD-10-CM | POA: Insufficient documentation

## 2023-01-09 DIAGNOSIS — B181 Chronic viral hepatitis B without delta-agent: Secondary | ICD-10-CM | POA: Insufficient documentation

## 2023-01-09 DIAGNOSIS — Z1722 Progesterone receptor negative status: Secondary | ICD-10-CM | POA: Diagnosis not present

## 2023-01-09 DIAGNOSIS — Z171 Estrogen receptor negative status [ER-]: Secondary | ICD-10-CM | POA: Insufficient documentation

## 2023-01-09 DIAGNOSIS — Z923 Personal history of irradiation: Secondary | ICD-10-CM | POA: Insufficient documentation

## 2023-01-09 DIAGNOSIS — Z8719 Personal history of other diseases of the digestive system: Secondary | ICD-10-CM | POA: Diagnosis not present

## 2023-01-09 NOTE — Progress Notes (Signed)
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  She arrived today with a mandarin interpreter.   The patient, with a history of breast cancer, presents with a chronic cough that has been ongoing for the past year. The cough is particularly bothersome in the morning and at night, and is associated with a sensation of something being 'blocked' in the throat. The patient reports a noise accompanying the cough and a feeling of discomfort in the throat. The patient has not noticed any other symptoms and denies any recent changes in health. The patient has a history of COVID-19 infection from 2021, but does not believe the cough is related to this. The patient is concerned about the possibility of asthma, but has not been formally evaluated for this condition. She also mentioned that she might be moving to [place] and requested medical records. She denies any change in breathing, bowel habits or urinary habits.   Rest of the pertinent 10 point ROS reviewed and negative.  COVID 19 VACCINATION STATUS:    HISTORY OF PRESENT ILLNESS: From [name]'s new patient evaluation dated 09/13/2009:  "This is a pleasant 52 year old woman of Chinese extraction here today with an interpreter for evaluation and discussion of recent diagnosis of breast cancer. 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  She apparently has had a problem  with a nipple discharge for a period of a year.  She had imaging studies at [facility] in [place] and subsequently had an FNA done at [facility].  Per history, she has been having nipple discharge for intermittently over a year.  She had implants placed as well.  Prior to that, the FNA reports suggested the presence of normal calcifications in ducts at the 11 o'clock position of the left breast 2.0 cm from the nipple.  She appeared to also have an abnormality adjacent to that.  On the 4th of April, she had an FNA of the abnormal area at 64 o' clock, 2.0 cm from the nipple, one smear was diagnostic and showed atypical cells consistent with a diagnosis of adenocarcinoma. There was an additional aspirate, which showed fibrocystic changes.  The patient returned and wished to have her further workup in [place].  She subsequently had another mammogram pre-MRI done on 08/08/2009, which showed subpectoral implants.  This plate showed heterogeneous dense breast tissue and no dominant masses were seen.  The MRI scan from 08/08/2009 showed that in the upper inner subarea of the left breast at 9:30 is a 1.2 x 1.1 x 0.6 cm ill-defined enhancing mass seen, which appeared to correspond to the calcification seen in the mammograms from [place], posterior and medially and upper inner left breast at 10 o'clock position a 1.2 x 1.2 x 0.6 cm enhancing mass was seen.  It was not clear whether this represented an additional area that was previously described at the [place] mammograms, as we do  not have the copies of those reports.  Of note is that the patient had suspicious microcalcifications of the left nipple seen as well.  There was injected material seen in the subpectoral areas of both breasts.  The patient has been initially seen by Dr. Donell Beers and now Dr. Luisa Hart and the plan is to have her undergo a mastectomy of the left breast with reconstruction and insertion of new implants."   Her subsequent history is  as detailed below.   PAST MEDICAL HISTORY: Past Medical History:  Diagnosis Date   Anemia    Breast cancer (HCC) 2011   Duodenal ulcer    Gastritis    GI bleed    Hepatitis B carrier (HCC) 1998   Hepatitis B infection    Peptic ulcer disease    Personal history of chemotherapy 2011   Personal history of radiation therapy 2011   S/P left mastectomy 2011    PAST SURGICAL HISTORY: Past Surgical History:  Procedure Laterality Date   left modified mastectomy  2011   MASTECTOMY Left 2011   PORT-A-CATH REMOVAL  02/04/2011   Procedure: REMOVAL PORT-A-CATH;  Surgeon: Clovis Pu. Cornett, MD;  Location: [facility] OR;  Service: General;  Laterality: N/A;   PORTACATH PLACEMENT      FAMILY HISTORY Family History  Problem Relation Age of Onset   Hepatitis Mother    Hepatitis B Mother     GYNECOLOGIC HISTORY: Gravida 4, para 2, Menarche age 52,  Premenopausal.  Continues to have regular periods   SOCIAL HISTORY: (Updated September 2019) Married for 20+ years, two teenaged sons age 58 (looking for a job) and 21 (in [employer])  She and her husband work in [employer], which is currently closed because of the pandemic. She enjoys working on the computer and watching TV in her spare time.    ADVANCED DIRECTIVES: In the absence of any documentation to the contrary, the patient's spouse is their HCPOA.    HEALTH MAINTENANCE: Social History   Tobacco Use   Smoking status: Never   Smokeless tobacco: Never  Substance Use Topics   Alcohol use: No   Drug use: No    No Known Allergies  Current Outpatient Medications  Medication Sig Dispense Refill   Ferrous Fumarate (HEMOCYTE - 106 MG FE) 324 (106 Fe) MG TABS tablet Take 1 tablet (106 mg of iron total) by mouth daily. 30 tablet 6   No current facility-administered medications for this visit.    OBJECTIVE:   Vitals:   01/09/23 1301  BP: 111/67  Pulse: 67  Resp: 17  Temp: 98.1 F (36.7 C)  SpO2: 96%   Wt Readings from Last 3  Encounters:  01/09/23 133 lb 11.2 oz (60.6 kg)  11/27/21 130 lb 8 oz (59.2 kg)  12/30/19 137 lb 6.4 oz (62.3 kg)   Body mass index is 22.25 kg/m.    Physical Exam Constitutional:      Appearance: Normal appearance.  Chest:     Comments: Right breast with implant.  No palpable mass.  No palpable mass in the left chest wall.  No regional adenopathy. Musculoskeletal:     Cervical back: Normal range of motion and neck supple. No rigidity.  Lymphadenopathy:     Cervical: No cervical adenopathy.  Neurological:     Mental Status: She is alert.       LAB RESULTS:  CMP     Component Value Date/Time   NA 140 11/27/2021 1518   NA 138 11/19/2016 1343  K 3.3 (L) 11/27/2021 1518   K 3.9 11/19/2016 1343   CL 109 11/27/2021 1518   CL 107 06/16/2012 1514   CO2 29 11/27/2021 1518   CO2 25 11/19/2016 1343   GLUCOSE 109 (H) 11/27/2021 1518   GLUCOSE 90 11/19/2016 1343   GLUCOSE 103 (H) 06/16/2012 1514   BUN 13 11/27/2021 1518   BUN 10.6 11/19/2016 1343   CREATININE 0.61 11/27/2021 1518   CREATININE 0.8 11/19/2016 1343   CALCIUM 8.6 (L) 11/27/2021 1518   CALCIUM 9.7 11/19/2016 1343   PROT 6.4 (L) 11/27/2021 1518   PROT 7.4 11/19/2016 1343   ALBUMIN 3.6 11/27/2021 1518   ALBUMIN 3.7 11/19/2016 1343   AST 14 (L) 11/27/2021 1518   AST 23 11/19/2016 1343   ALT 17 11/27/2021 1518   ALT 23 11/19/2016 1343   ALKPHOS 82 11/27/2021 1518   ALKPHOS 69 11/19/2016 1343   BILITOT 0.5 11/27/2021 1518   BILITOT 0.74 11/19/2016 1343   GFRNONAA >60 11/27/2021 1518   GFRAA >60 12/30/2019 1433   GFRAA >60 12/01/2018 1105    No results found for: "TOTALPROTELP", "ALBUMINELP", "A1GS", "A2GS", "BETS", "BETA2SER", "GAMS", "MSPIKE", "SPEI"  Lab Results  Component Value Date   WBC 5.1 11/27/2021   NEUTROABS 3.4 11/27/2021   HGB 11.5 (L) 11/27/2021   HCT 34.3 (L) 11/27/2021   MCV 90.0 11/27/2021   PLT 259 11/27/2021    Lab Results  Component Value Date   LABCA2 10 02/18/2012    No  components found for: "IONGEX528"  No results for input(s): "INR" in the last 168 hours.  Lab Results  Component Value Date   LABCA2 10 02/18/2012    No results found for: "CAN199"  No results found for: "CAN125"  No results found for: "CAN153"  No results found for: "CA2729"  No components found for: "HGQUANT"  No results found for: "CEA1", "CEA" / No results found for: "CEA1", "CEA"   No results found for: "AFPTUMOR"  No results found for: "CHROMOGRNA"  No results found for: "KPAFRELGTCHN", "LAMBDASER", "KAPLAMBRATIO" (kappa/lambda light chains)  No results found for: "HGBA", "HGBA2QUANT", "HGBFQUANT", "HGBSQUAN" (Hemoglobinopathy evaluation)   Lab Results  Component Value Date   LDH 171 02/18/2012    No results found for: "IRON", "TIBC", "IRONPCTSAT" (Iron and TIBC)  No results found for: "FERRITIN"  Urinalysis    Component Value Date/Time   LABSPEC 1.005 04/24/2010 1413   PHURINE 6.5 04/24/2010 1413   HGBUR Large 04/24/2010 1413   BILIRUBINUR Negative 04/24/2010 1413   KETONESUR Negative 04/24/2010 1413   PROTEINUR < 30 04/24/2010 1413   NITRITE Negative 04/24/2010 1413   LEUKOCYTESUR Moderate 04/24/2010 1413    STUDIES: No results found.   ASSESSMENT: 52 y.o. BRCA negative [place], [place] Mandarin speaker:  1. Status post left mastectomy on 10/31/2009 with sentinel node biopsy for a T1b N0, stage IA, invasive ductal carcinoma of the left breast, grade 2, ER 0%, PR 0%, KIi-67 83%, HER-2/neu amplified by CISH with a ratio of 4.71. The patient had 2/3 positive lymph nodes for isolated tumor cells.  2.  S/p adjuvant Taxotere/Carboplatin/Herceptin from 01/03/2010 through 03/06/2010 x 6 cycles. Herceptin therapy was continued until 01/08/2011. Her last echocardiogram was on 03/13/2010 which showed an ejection fraction of 60-65%.  4. The patient underwent radiation therapy from 07/03/2010 through 08/17/2010.  5. Left chest postoperative  seroma and left mastectomy bed with palpable fluctuance found by 05/05/2013   6. Patient has been offered genetics testing  PLAN: Patient is here for follow-up.    Chronic Cough Cough present mainly in the morning and at night, with a sensation of blockage. No clear etiology identified. Possible differential diagnoses include acid reflux or asthma. -Trial of over-the-counter Omeprazole suggested for potential acid reflux. -Consider referral to pulmonology for further evaluation if cough persists. -She was recommended to establish with primary care physician for follow-up of these nononcology related conditions. No abnormal ENT findings within the scope of the exam.  Breast Cancer Follow-up No current complaints related to breast cancer. -Order mammogram to be done at the breast center for right breast only.   Medical Records Patient moving to [place] and requires medical records. -Provide patient with medical records department contact information for record request.  Follow-up No immediate follow-up planned in this clinic. Patient advised to contact primary care provider if cough does not improve.  Total time spent: 40 min, 25 minutes was spent face-to-face with the patient, most of the conversation was about the cough as well as some other issues such as requested medical records.   *Total Encounter Time as defined by the Centers for Medicare and Medicaid Services includes, in addition to the face-to-face time of a patient visit (documented in the note above) non-face-to-face time: obtaining and reviewing outside history, ordering and reviewing medications, tests or procedures, care coordination (communications with other health care professionals or caregivers) and documentation in the medical record.

## 2023-02-26 LAB — EXTERNAL GENERIC LAB PROCEDURE

## 2023-03-15 LAB — EXTERNAL GENERIC LAB PROCEDURE
